# Patient Record
Sex: Female | Born: 1940 | Race: White | Hispanic: No | Marital: Married | Smoking: Never smoker
Health system: Southern US, Community
[De-identification: ages and names within clinical notes are randomized; demographics above are authoritative.]

## PROBLEM LIST (undated history)

## (undated) DIAGNOSIS — G43909 Migraine, unspecified, not intractable, without status migrainosus: Secondary | ICD-10-CM

## (undated) DIAGNOSIS — I639 Cerebral infarction, unspecified: Secondary | ICD-10-CM

## (undated) DIAGNOSIS — I619 Nontraumatic intracerebral hemorrhage, unspecified: Secondary | ICD-10-CM

## (undated) DIAGNOSIS — I671 Cerebral aneurysm, nonruptured: Secondary | ICD-10-CM

---

## 2005-04-30 DIAGNOSIS — I619 Nontraumatic intracerebral hemorrhage, unspecified: Secondary | ICD-10-CM

## 2005-04-30 HISTORY — DX: Nontraumatic intracerebral hemorrhage, unspecified: I61.9

## 2005-04-30 HISTORY — PX: BRAIN SURGERY: SHX531

## 2014-10-28 ENCOUNTER — Other Ambulatory Visit: Payer: Self-pay | Admitting: Family

## 2014-10-28 DIAGNOSIS — Z1231 Encounter for screening mammogram for malignant neoplasm of breast: Secondary | ICD-10-CM

## 2014-11-16 ENCOUNTER — Observation Stay (HOSPITAL_COMMUNITY)
Admission: EM | Admit: 2014-11-16 | Discharge: 2014-11-18 | Disposition: A | Discharge status: Home / Self Care | Payer: PPO | Attending: Neurology | Admitting: Neurology

## 2014-11-16 ENCOUNTER — Emergency Department (HOSPITAL_COMMUNITY): Payer: PPO

## 2014-11-16 ENCOUNTER — Encounter (HOSPITAL_COMMUNITY): Payer: Self-pay | Admitting: Emergency Medicine

## 2014-11-16 DIAGNOSIS — F419 Anxiety disorder, unspecified: Secondary | ICD-10-CM | POA: Insufficient documentation

## 2014-11-16 DIAGNOSIS — I10 Essential (primary) hypertension: Secondary | ICD-10-CM | POA: Diagnosis not present

## 2014-11-16 DIAGNOSIS — Z8673 Personal history of transient ischemic attack (TIA), and cerebral infarction without residual deficits: Secondary | ICD-10-CM | POA: Insufficient documentation

## 2014-11-16 DIAGNOSIS — Z66 Do not resuscitate: Secondary | ICD-10-CM | POA: Diagnosis not present

## 2014-11-16 DIAGNOSIS — Z8679 Personal history of other diseases of the circulatory system: Secondary | ICD-10-CM

## 2014-11-16 DIAGNOSIS — R1013 Epigastric pain: Secondary | ICD-10-CM | POA: Insufficient documentation

## 2014-11-16 DIAGNOSIS — G43909 Migraine, unspecified, not intractable, without status migrainosus: Secondary | ICD-10-CM | POA: Diagnosis not present

## 2014-11-16 DIAGNOSIS — G43009 Migraine without aura, not intractable, without status migrainosus: Secondary | ICD-10-CM

## 2014-11-16 DIAGNOSIS — F32A Depression, unspecified: Secondary | ICD-10-CM | POA: Diagnosis present

## 2014-11-16 DIAGNOSIS — E039 Hypothyroidism, unspecified: Secondary | ICD-10-CM | POA: Diagnosis not present

## 2014-11-16 DIAGNOSIS — R079 Chest pain, unspecified: Principal | ICD-10-CM | POA: Diagnosis present

## 2014-11-16 DIAGNOSIS — E785 Hyperlipidemia, unspecified: Secondary | ICD-10-CM | POA: Diagnosis not present

## 2014-11-16 DIAGNOSIS — F329 Major depressive disorder, single episode, unspecified: Secondary | ICD-10-CM | POA: Diagnosis not present

## 2014-11-16 DIAGNOSIS — R1310 Dysphagia, unspecified: Secondary | ICD-10-CM

## 2014-11-16 DIAGNOSIS — R110 Nausea: Secondary | ICD-10-CM | POA: Diagnosis not present

## 2014-11-16 HISTORY — DX: Cerebral infarction, unspecified: I63.9

## 2014-11-16 HISTORY — DX: Nontraumatic intracerebral hemorrhage, unspecified: I61.9

## 2014-11-16 HISTORY — DX: Cerebral aneurysm, nonruptured: I67.1

## 2014-11-16 HISTORY — DX: Migraine, unspecified, not intractable, without status migrainosus: G43.909

## 2014-11-16 LAB — CBC WITH DIFFERENTIAL/PLATELET
Basophils Absolute: 0 10*3/uL (ref 0.0–0.1)
Basophils Relative: 1 % (ref 0–1)
Eosinophils Absolute: 0.1 10*3/uL (ref 0.0–0.7)
Eosinophils Relative: 1 % (ref 0–5)
HCT: 36.4 % (ref 36.0–46.0)
Hemoglobin: 12 g/dL (ref 12.0–15.0)
Lymphocytes Relative: 23 % (ref 12–46)
Lymphs Abs: 1.3 10*3/uL (ref 0.7–4.0)
MCH: 29.6 pg (ref 26.0–34.0)
MCHC: 33 g/dL (ref 30.0–36.0)
MCV: 89.9 fL (ref 78.0–100.0)
MONO ABS: 0.5 10*3/uL (ref 0.1–1.0)
Monocytes Relative: 9 % (ref 3–12)
NEUTROS PCT: 66 % (ref 43–77)
Neutro Abs: 3.9 10*3/uL (ref 1.7–7.7)
Platelets: 170 10*3/uL (ref 150–400)
RBC: 4.05 MIL/uL (ref 3.87–5.11)
RDW: 12.5 % (ref 11.5–15.5)
WBC: 5.8 10*3/uL (ref 4.0–10.5)

## 2014-11-16 LAB — URINALYSIS, ROUTINE W REFLEX MICROSCOPIC
BILIRUBIN URINE: NEGATIVE
Glucose, UA: NEGATIVE mg/dL
Ketones, ur: NEGATIVE mg/dL
Nitrite: NEGATIVE
Protein, ur: 30 mg/dL — AB
SPECIFIC GRAVITY, URINE: 1.005 (ref 1.005–1.030)
Urobilinogen, UA: 0.2 mg/dL (ref 0.0–1.0)
pH: 6.5 (ref 5.0–8.0)

## 2014-11-16 LAB — URINE MICROSCOPIC-ADD ON

## 2014-11-16 LAB — BASIC METABOLIC PANEL
Anion gap: 5 (ref 5–15)
BUN: 15 mg/dL (ref 6–20)
CHLORIDE: 110 mmol/L (ref 101–111)
CO2: 25 mmol/L (ref 22–32)
CREATININE: 0.81 mg/dL (ref 0.44–1.00)
Calcium: 8.7 mg/dL — ABNORMAL LOW (ref 8.9–10.3)
GFR calc Af Amer: >60 mL/min (ref >60)
GFR calc non Af Amer: >60 mL/min (ref >60)
Glucose, Bld: 131 mg/dL — ABNORMAL HIGH (ref 65–99)
Potassium: 3.6 mmol/L (ref 3.5–5.1)
Sodium: 140 mmol/L (ref 135–145)

## 2014-11-16 LAB — TROPONIN I: Troponin I: <0.03 ng/mL (ref <0.031)

## 2014-11-16 LAB — BRAIN NATRIURETIC PEPTIDE: B NATRIURETIC PEPTIDE 5: 61.1 pg/mL (ref 0.0–100.0)

## 2014-11-16 MED ORDER — LORAZEPAM 2 MG/ML IJ SOLN
0.5000 mg | Freq: Once | INTRAMUSCULAR | Status: AC
  Administered 2014-11-16: 0.5 mg via INTRAVENOUS

## 2014-11-16 MED ORDER — LORAZEPAM 2 MG/ML IJ SOLN
2.0000 mg | Freq: Once | INTRAMUSCULAR | Status: DC
  Filled 2014-11-16: qty 1

## 2014-11-16 MED ORDER — ACETAMINOPHEN 325 MG PO TABS: 650.0000 mg | ORAL_TABLET | ORAL | Status: DC | PRN

## 2014-11-16 MED ORDER — METOPROLOL TARTRATE 25 MG PO TABS
25.0000 mg | ORAL_TABLET | Freq: Two times a day (BID) | ORAL | Status: DC
  Administered 2014-11-16 – 2014-11-18 (×4): 25 mg via ORAL
  Filled 2014-11-16 (×6): qty 1

## 2014-11-16 MED ORDER — ENOXAPARIN SODIUM 40 MG/0.4ML ~~LOC~~ SOLN
40.0000 mg | Freq: Every day | SUBCUTANEOUS | Status: DC
  Administered 2014-11-16 – 2014-11-17 (×2): 40 mg via SUBCUTANEOUS
  Filled 2014-11-16 (×4): qty 0.4

## 2014-11-16 MED ORDER — NITROGLYCERIN 0.4 MG SL SUBL
0.4000 mg | SUBLINGUAL_TABLET | SUBLINGUAL | Status: DC | PRN
  Administered 2014-11-16: 0.4 mg via SUBLINGUAL
  Filled 2014-11-16: qty 1

## 2014-11-16 MED ORDER — BUSPIRONE HCL 10 MG PO TABS
10.0000 mg | ORAL_TABLET | Freq: Two times a day (BID) | ORAL | Status: DC
  Administered 2014-11-16 – 2014-11-18 (×4): 10 mg via ORAL
  Filled 2014-11-16 (×5): qty 1

## 2014-11-16 MED ORDER — GI COCKTAIL ~~LOC~~
30.0000 mL | Freq: Four times a day (QID) | ORAL | Status: DC | PRN
  Administered 2014-11-17: 30 mL via ORAL
  Filled 2014-11-16: qty 30

## 2014-11-16 MED ORDER — ASPIRIN EC 325 MG PO TBEC
325.0000 mg | DELAYED_RELEASE_TABLET | Freq: Every day | ORAL | Status: DC
  Administered 2014-11-16 – 2014-11-18 (×3): 325 mg via ORAL
  Filled 2014-11-16 (×3): qty 1

## 2014-11-16 MED ORDER — LEVOTHYROXINE SODIUM 25 MCG PO TABS
25.0000 ug | ORAL_TABLET | Freq: Every day | ORAL | Status: DC
  Administered 2014-11-17 – 2014-11-18 (×2): 25 ug via ORAL
  Filled 2014-11-16 (×4): qty 1

## 2014-11-16 MED ORDER — ATORVASTATIN CALCIUM 20 MG PO TABS
20.0000 mg | ORAL_TABLET | Freq: Every day | ORAL | Status: DC
  Administered 2014-11-16 – 2014-11-17 (×2): 20 mg via ORAL
  Filled 2014-11-16 (×4): qty 1

## 2014-11-16 MED ORDER — MIRTAZAPINE 30 MG PO TABS
30.0000 mg | ORAL_TABLET | Freq: Every day | ORAL | Status: DC
  Administered 2014-11-16 – 2014-11-17 (×2): 30 mg via ORAL
  Filled 2014-11-16: qty 2
  Filled 2014-11-16 (×3): qty 1

## 2014-11-16 MED ORDER — TOPIRAMATE 25 MG PO TABS
25.0000 mg | ORAL_TABLET | Freq: Two times a day (BID) | ORAL | Status: DC
  Administered 2014-11-16 – 2014-11-18 (×4): 25 mg via ORAL
  Filled 2014-11-16 (×7): qty 1

## 2014-11-16 MED ORDER — DARIFENACIN HYDROBROMIDE ER 7.5 MG PO TB24
7.5000 mg | ORAL_TABLET | Freq: Every day | ORAL | Status: DC
  Administered 2014-11-17: 7.5 mg via ORAL
  Filled 2014-11-16 (×2): qty 1

## 2014-11-16 MED ORDER — ONDANSETRON HCL 4 MG/2ML IJ SOLN: 4.0000 mg | Freq: Four times a day (QID) | INTRAMUSCULAR | Status: DC | PRN

## 2014-11-16 MED ORDER — PRAMIPEXOLE DIHYDROCHLORIDE 0.25 MG PO TABS
0.2500 mg | ORAL_TABLET | Freq: Every day | ORAL | Status: DC
  Administered 2014-11-16 – 2014-11-17 (×2): 0.25 mg via ORAL
  Filled 2014-11-16 (×3): qty 1

## 2014-11-16 NOTE — ED Notes (Signed)
Report attempted 

## 2014-11-16 NOTE — ED Notes (Signed)
Spoke to MD about pt placement

## 2014-11-16 NOTE — ED Notes (Signed)
Meal tray arrived

## 2014-11-16 NOTE — ED Provider Notes (Signed)
CSN: [id number]     Arrival date & time 11/16/14  1455 History   First MD Initiated Contact with Patient 11/16/14 1508     Chief Complaint  Patient presents with  . Chest Pain     (Consider location/radiation/quality/duration/timing/severity/associated sxs/prior Treatment) HPI Comments: 74 y/o F comes in with cc of epigastric chest pain. She has hx of brain AN, HL. She reports just having started eating when she started having chest tightness, epigastric, pressure like pain. The pain was non radiating. Pt also had associated nausea and dib. EMS reports anxious appearing pt when they arrived. She received full dose aspirin and nitro, and her pain resolved, and she is currently chest pain free. Pt reports no hx of similar pain. She has not had a cardiac workup. She also denies any GERD hx, esophagitis.   ROS 10 Systems reviewed and are negative for acute change except as noted in the HPI.     The history is provided by the patient.    Past Medical History  Diagnosis Date  . Brain aneurysm   . Hemorrhagic stroke 2007  . Migraines    Past Surgical History  Procedure Laterality Date  . Brain surgery  2007   No family history on file. History  Substance Use Topics  . Smoking status: Never Smoker   . Smokeless tobacco: Not on file  . Alcohol Use: No   OB History    No data available     Review of Systems  Cardiovascular: Positive for chest pain.  All other systems reviewed and are negative.     Allergies  Review of patient's allergies indicates no known allergies.  Home Medications   Prior to Admission medications   Medication Sig Start Date End Date Taking? Authorizing Provider  acetaminophen (TYLENOL) 325 MG tablet Take 325 mg by mouth every 6 (six) hours as needed.   Yes Historical Provider, MD  atorvastatin (LIPITOR) 20 MG tablet Take 20 mg by mouth daily.   Yes Historical Provider, MD  busPIRone (BUSPAR) 10 MG tablet Take 10 mg by mouth 2 (two) times daily.    Yes Historical Provider, MD  levothyroxine (SYNTHROID, LEVOTHROID) 25 MCG tablet Take 25 mcg by mouth daily before breakfast.   Yes Historical Provider, MD  mirtazapine (REMERON) 30 MG tablet Take 30 mg by mouth at bedtime.   Yes Historical Provider, MD  pramipexole (MIRAPEX) 0.25 MG tablet Take 0.25 mg by mouth at bedtime.   Yes Historical Provider, MD  solifenacin (VESICARE) 10 MG tablet Take 10 mg by mouth at bedtime.   Yes Historical Provider, MD  topiramate (TOPAMAX) 25 MG tablet Take 25 mg by mouth 2 (two) times daily.   Yes Historical Provider, MD   BP 120/50 mmHg  Pulse 68  Temp(Src) 98.3 F (36.8 C) (Oral)  Resp 19  Ht  (1.626 m)  Wt 190 lb (86.183 kg)  BMI 32.60 kg/m2  SpO2 94% Physical Exam  Constitutional: She is oriented to person, place, and time. She appears well-developed and well-nourished.  HENT:  Head: Normocephalic and atraumatic.  Eyes: EOM are normal. Pupils are equal, round, and reactive to light.  Neck: Neck supple.  Cardiovascular: Normal rate, regular rhythm, normal heart sounds and intact distal pulses.   No murmur heard. Pulmonary/Chest: Effort normal. No respiratory distress.  Abdominal: Soft. She exhibits no distension. There is no tenderness. There is no rebound and no guarding.  Neurological: She is alert and oriented to person, place, and time.  Skin:  Skin is warm and dry.  Nursing note and vitals reviewed.   ED Course  Procedures (including critical care time) Labs Review Labs Reviewed  BASIC METABOLIC PANEL - Abnormal; Notable for the following:    Glucose, Bld 131 (*)    Calcium 8.7 (*)    All other components within normal limits  URINALYSIS, ROUTINE W REFLEX MICROSCOPIC (NOT AT [facility]) - Abnormal; Notable for the following:    APPearance CLOUDY (*)    Hgb urine dipstick LARGE (*)    Protein, ur 30 (*)    Leukocytes, UA TRACE (*)    All other components within normal limits  URINE MICROSCOPIC-ADD ON - Abnormal; Notable for the  following:    Bacteria, UA MANY (*)    All other components within normal limits  URINE CULTURE  CBC WITH DIFFERENTIAL/PLATELET  TROPONIN I  BRAIN NATRIURETIC PEPTIDE    Imaging Review Dg Chest 2 View  11/16/2014   CLINICAL DATA:  Sudden onset of chest and epigastric pain while eating lunch today. Initial encounter.  EXAM: CHEST  2 VIEW  COMPARISON:  None.  FINDINGS: The heart size is normal. There is mild aortic tortuosity. The lungs appear well aerated on the frontal examination. On the lateral view, there is a lesser degree of inspiration with probable mild bibasilar atelectasis. No edema, pleural effusion or pneumothorax. The bones appear unremarkable.  IMPRESSION: No acute cardiopulmonary process.  Aortic tortuosity.   Electronically Signed   By: Carey BullocksWilliam  Veazey M.D.   On: 11/16/2014 16:12     EKG Interpretation   Date/Time:  Tuesday November 16 2014 14:55:40 EDT Ventricular Rate:  82 PR Interval:    QRS Duration: 112 QT Interval:  415 QTC Calculation: 485 R Axis:   20 Text Interpretation:  Atrial fibrillation Borderline intraventricular  conduction delay ST elevation, consider lateral injury Artifact in lead(s)  I aVL too much artifact Confirmed by Rhunette CroftNANAVATI, MD, Shriya Aker [phone]([id number]) on  11/16/2014 3:12:56 PM     EKG Interpretation  Date/Time:  Tuesday November 16 2014 17:58:20 EDT Ventricular Rate:  140 PR Interval:  198 QRS Duration: 74 QT Interval:  352 QTC Calculation: 537 R Axis:   4 Text Interpretation:  Sinus rhythm artifact vs flutter waves Confirmed by Rhunette CroftNANAVATI, MD, Janey GentaANKIT [phone]([id number]) on 11/16/2014 6:15:46 PM          MDM   Final diagnoses:  Chest pain     Pt comes in with cc of chest pain. Chest pain is epigastric, pressure type and responded to nitro. It is atypical.  Differential diagnosis includes: ACS syndrome Pericarditis Pericardial effusion PE Musculoskeletal pain GERD Esophageal spasm  Pt comes in with cc of chest pain, now resolved.  HEAR score is  3, age and risk factors. It appears to be GERD/PUD, but she has no hx of it, and with some of the other constitutionals, we feel it will be best to keep her in for acs rule out.  EKG - no acute changes.  Derwood KaplanAnkit Talvin Christianson, MD 11/16/14 1820

## 2014-11-16 NOTE — ED Notes (Signed)
Delay explained to pt on bed placement and order for heart healthy tray placed by secretary.

## 2014-11-16 NOTE — ED Notes (Signed)
Per EMS:  Eating lunch, sudden onset cp/epigastric pain.  Anxious upon ems arrival with hx of anxiety.  12 lead unremarkable, although lots of artifact.  20 gauge left hand.  324 mg asa given and 3 SL NTG's,  Currently denies pain.  Initially hypertensive 198/100, 175/93 after NTG.   Currently 83 HR, 26, 162/77, 95%.  Denies sob, initially had nausea but was gone by the time EMS arrived.

## 2014-11-16 NOTE — H&P (Deleted)
[facility] History and Physical  Phyllis AveCarolyn Mccarn ZOX:[id number] DOB: 08/29/1940 DOA: 11/16/2014  Referring physician: Dr Rhunette CroftNanavati PCP: No primary care provider on file.   Chief Complaint: Chest pain  HPI: Phyllis Nielsen is a 74 y.o. 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  Was eating dinner at her apt today and early into the meal (chicken) she developed an epigastric discomfort which turned into pain w assoc nausea.  Didn't want to vomit in the cafeteria. Tried drinking liquids but that didn't help.. No radiation, no SOB , +anxious.  EMS called and with SL NTG her symptoms resolved.  Brought to ED where EKG is poor tracing due to tremor artifact but shows NSR w/o acute ischemic changes. CXR is neg, UA neg, labs are all wnl.  Trop is normal and BNP is normal. She has hx of swollen ankles lately, no hx CHF, no prior CAD/ MI/ stent. Nonsmoker, no hx DM or HTN.       Home meds > Lipitor, Buspar, synthroid, Remeron, Mirapex, Vesicare, Topamax  ROS  denies HA  no neck pain  no abd pain  no diarrhea  no rash  no joint pain  no fevers  Where does patient live independent living for the elderly Can patient participate in ADLs? yes  Past Medical History  Past Medical History  Diagnosis Date  . Brain aneurysm   . Hemorrhagic stroke 2007  . Migraines    Past Surgical History  Past Surgical History  Procedure Laterality Date  . Brain surgery  2007   Family History No family history on file.  Social History  reports that she has never smoked. She does not have any smokeless tobacco history on file. She reports that she does not drink alcohol. Her drug history is not on file. Allergies No Known Allergies Home medications Prior to Admission medications   Medication Sig Start Date End Date Taking? Authorizing Provider  acetaminophen (TYLENOL) 325 MG tablet Take 325 mg by mouth every 6 (six) hours as needed.   Yes  Historical Provider, MD  atorvastatin (LIPITOR) 20 MG tablet Take 20 mg by mouth daily.   Yes Historical Provider, MD  busPIRone (BUSPAR) 10 MG tablet Take 10 mg by mouth 2 (two) times daily.   Yes Historical Provider, MD  levothyroxine (SYNTHROID, LEVOTHROID) 25 MCG tablet Take 25 mcg by mouth daily before breakfast.   Yes Historical Provider, MD  mirtazapine (REMERON) 30 MG tablet Take 30 mg by mouth at bedtime.   Yes Historical Provider, MD  pramipexole (MIRAPEX) 0.25 MG tablet Take 0.25 mg by mouth at bedtime.   Yes Historical Provider, MD  solifenacin (VESICARE) 10 MG tablet Take 10 mg by mouth at bedtime.   Yes Historical Provider, MD  topiramate (TOPAMAX) 25 MG tablet Take 25 mg by mouth 2 (two) times daily.   Yes Historical Provider, MD   Liver Function Tests No results for input(s): AST, ALT, ALKPHOS, BILITOT, PROT, ALBUMIN in the last 168 hours. No results for input(s): LIPASE, AMYLASE in the last 168 hours. CBC  Recent Labs Lab 11/16/14 1552  WBC 5.8  NEUTROABS 3.9  HGB 12.0  HCT 36.4  MCV 89.9  PLT 170   Basic Metabolic Panel  Recent Labs Lab 11/16/14 1552  NA 140  K 3.6  CL 110  CO2 25  GLUCOSE 131*  BUN 15  CREATININE 0.81  CALCIUM 8.7*   Filed Vitals:   11/16/14 1511 11/16/14 1512 11/16/14 1651  BP: 162/77  120/50  Pulse: 91  68  Temp:  98.3 F (36.8 C)   TempSrc: Oral Oral   Resp: 16  19  Height:  (1.626 m)    Weight: 86.183 kg (190 lb)    SpO2: 94%  94%   Exam: Alert, a little anxious and tremulous, not in distress No rash, cyanosis or gangrene Sclera anicteric, throat clear Small bony defect R temple region from prior rupt aneurysm No jvd Chest clear bilat RRR no MRG Abd soft ntnd no mass or ascites  EKG (reviewed personally) > NSR 80's, Q wave lead III, lots of artifact due to tremors but on exam has stable rhythm c/w NSR CXR (reviewed personally) > stable, no acute cardiopulm process BUN 15  Creat 0.81  Trop <0.03  BNP 61 WBC 5k,   Hb 12  plt 170 UA negative  Assessment: 1. Chest pain - occurred while eating, sustained, responded to SL NTG. Not a lot of risk factors. Trop neg. EKG prob neg too but w/ sig tremor artifact. Plan admit, serial trop's, echo in am.  2. Hx ruptured cerebral aneurysm/ hemorrhagic stroke 3. HL 4. Migraine HA's on topomax 5. Depression on meds 6. DNAR - pt request, ready to "see my father and two dead husbands when my time comes". Daughter present at time of discussion and supports her decision.   Plan - as above   DVT Prophylaxis lovenox  Code Status: DNAR  Family Communication: daughter at bedside  Disposition Plan: home when stable    Mabrey Howland D [facility] Pager [phone]  If 7PM-7AM, please contact night-coverage [email or web address] Password [facility] 11/16/2014, 5:47 PM

## 2014-11-17 ENCOUNTER — Observation Stay (HOSPITAL_COMMUNITY): Payer: PPO

## 2014-11-17 DIAGNOSIS — R079 Chest pain, unspecified: Secondary | ICD-10-CM

## 2014-11-17 DIAGNOSIS — E785 Hyperlipidemia, unspecified: Secondary | ICD-10-CM | POA: Diagnosis not present

## 2014-11-17 DIAGNOSIS — K219 Gastro-esophageal reflux disease without esophagitis: Secondary | ICD-10-CM | POA: Diagnosis not present

## 2014-11-17 DIAGNOSIS — Z66 Do not resuscitate: Secondary | ICD-10-CM | POA: Diagnosis not present

## 2014-11-17 DIAGNOSIS — I10 Essential (primary) hypertension: Secondary | ICD-10-CM | POA: Diagnosis not present

## 2014-11-17 DIAGNOSIS — G43909 Migraine, unspecified, not intractable, without status migrainosus: Secondary | ICD-10-CM | POA: Diagnosis not present

## 2014-11-17 LAB — TROPONIN I: Troponin I: <0.03 ng/mL (ref <0.031)

## 2014-11-17 LAB — TSH: TSH: 2.605 u[IU]/mL (ref 0.350–4.500)

## 2014-11-17 LAB — MRSA PCR SCREENING: MRSA by PCR: NEGATIVE

## 2014-11-17 NOTE — Progress Notes (Signed)
[facility] PROGRESS NOTE  Phyllis Nielsen NFA:[id number] DOB: October 03, 1940 DOA: 11/16/2014 PCP: No primary care provider on file.  Assessment/Plan: 1. Chest pain -Patient presenting with chest pain having atypical features. She describes chest pain precipitated by eating. She denies having history of exertional shortness of breath or chest pain. Lab work showing 3 negative troponins, BNP of 61.1 and chest x-ray not show an acute infiltrate or acute cardiopulmonary disease. -I doubt chest pain of cardiac origin however I am suspicious of this being related to GI. She complains of retrosternal chest pain characterized as "burning" along with a sensation of food getting stuck in her esophagus.  -Will provide GI cocktail -Further workup with barium swallow. She was made nothing by mouth  2.  Dyslipidemia -Continue Lipitor 20 mg by mouth daily  3.  Hypothyroidism -Will check a TSH -Continue Synthroid 25 g by mouth daily  4.  Hypertension -Blood pressures controlled overnight, this morning having blood pressure 165/84, has not received a.m. meds -Continue metoprolol 25 mg by mouth twice a day  Code Status: DO NOT RESUSCITATE Family Communication: Family not present Disposition Plan: We'll transfer to telemetry   Consultants:    Procedures:    Antibiotics:    HPI/Subjective: Patient is a 74 year old female who was admitted to the medicine service on 11/16/2014 when she presented with complaints of sudden onset retrosternal chest pain that occurred while eating dinner. She also complained of epigastric discomfort associated with nausea, denied emesis. Initial workup in the emergency room included an EKG that show acute ischemic changes. Chest x-ray did not reveal infiltrate as troponins were negative.  Objective: Filed Vitals:   11/17/14 0816  BP: 165/84  Pulse: 71  Temp: 98.8 F (37.1 C)  Resp: 18    Intake/Output Summary (Last 24 hours) at 11/17/14 0820 Last data  filed at 11/17/14 0300  Gross per 24 hour  Intake      0 ml  Output    200 ml  Net   -200 ml   Filed Weights   11/16/14 1511  Weight: 86.183 kg (190 lb)    Exam:   General:  Patient is in no acute distress she is awake and alert oriented, currently denies chest pain although states she is afraid to eat for fear of precipitating chest pain  Cardiovascular: Regular rate and rhythm normal S1-S2  Respiratory: Normal respiratory effort lungs are clear  Abdomen: Soft nontender nondistended  Musculoskeletal: No edema  Data Reviewed: Basic Metabolic Panel:  Recent Labs Lab 11/16/14 1552  NA 140  K 3.6  CL 110  CO2 25  GLUCOSE 131*  BUN 15  CREATININE 0.81  CALCIUM 8.7*   Liver Function Tests: No results for input(s): AST, ALT, ALKPHOS, BILITOT, PROT, ALBUMIN in the last 168 hours. No results for input(s): LIPASE, AMYLASE in the last 168 hours. No results for input(s): AMMONIA in the last 168 hours. CBC:  Recent Labs Lab 11/16/14 1552  WBC 5.8  NEUTROABS 3.9  HGB 12.0  HCT 36.4  MCV 89.9  PLT 170   Cardiac Enzymes:  Recent Labs Lab 11/16/14 1552 11/16/14 2228 11/17/14 0129  TROPONINI <0.03 <0.03 <0.03   BNP (last 3 results)  Recent Labs  11/16/14 1552  BNP 61.1    ProBNP (last 3 results) No results for input(s): PROBNP in the last 8760 hours.  CBG: No results for input(s): GLUCAP in the last 168 hours.  Recent Results (from the past 240 hour(s))  MRSA PCR Screening  Status: None   Collection Time: 11/16/14 10:26 PM  Result Value Ref Range Status   MRSA by PCR NEGATIVE NEGATIVE Final    Comment:        The GeneXpert MRSA Assay (FDA approved for NASAL specimens only), is one component of a comprehensive MRSA colonization surveillance program. It is not intended to diagnose MRSA infection nor to guide or monitor treatment for MRSA infections.      Studies: Dg Chest 2 View  11/16/2014   CLINICAL DATA:  Sudden onset of chest and  epigastric pain while eating lunch today. Initial encounter.  EXAM: CHEST  2 VIEW  COMPARISON:  None.  FINDINGS: The heart size is normal. There is mild aortic tortuosity. The lungs appear well aerated on the frontal examination. On the lateral view, there is a lesser degree of inspiration with probable mild bibasilar atelectasis. No edema, pleural effusion or pneumothorax. The bones appear unremarkable.  IMPRESSION: No acute cardiopulmonary process.  Aortic tortuosity.   Electronically Signed   By: Carey BullocksWilliam  Veazey M.D.   On: 11/16/2014 16:12    Scheduled Meds: . aspirin EC  325 mg Oral Daily  . atorvastatin  20 mg Oral q1800  . busPIRone  10 mg Oral BID  . darifenacin  7.5 mg Oral Daily  . enoxaparin (LOVENOX) injection  40 mg Subcutaneous QHS  . levothyroxine  25 mcg Oral QAC breakfast  . metoprolol tartrate  25 mg Oral BID  . mirtazapine  30 mg Oral QHS  . pramipexole  0.25 mg Oral QHS  . topiramate  25 mg Oral BID   Continuous Infusions:   Active Problems:   Chest pain   History of ruptured cerebral arterial aneurysm   Depression   Migraine headache   Hyperlipidemia    Time spent: 35 minutes    Jeralyn BennettZAMORA, Phyllis Nielsen  [facility] Pager [phone]. If 7PM-7AM, please contact night-coverage at [email or web address], password [email or web address] 11/17/2014, 8:20 AM

## 2014-11-17 NOTE — H&P (Signed)
Phyllis Nielsen Signed Nephrology H&P 11/16/2014 5:47 PM     [facility] History and Physical  Phyllis Nielsen WUJ:[id number] DOB: 1940-10-24 DOA: 11/16/2014  Referring physician: Dr Rhunette Croft PCP: No primary care provider on file.   Chief Complaint: Chest pain  HPI: Phyllis Nielsen is a 74 y.o. 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 Was eating dinner at her apt today and early into the meal (chicken) she developed an epigastric discomfort which turned into pain w assoc nausea. Didn't want to vomit in the cafeteria. Tried drinking liquids but that didn't help.. No radiation, no SOB , +anxious. EMS called and with SL NTG her symptoms resolved. Brought to ED where EKG is poor tracing due to tremor artifact but shows NSR w/o acute ischemic changes. CXR is neg, UA neg, labs are all wnl. Trop is normal and BNP is normal. She has hx of swollen ankles lately, no hx CHF, no prior CAD/ MI/ stent. Nonsmoker, no hx DM or HTN.     Home meds > Lipitor, Buspar, synthroid, Remeron, Mirapex, Vesicare, Topamax  ROS denies HA no neck pain no abd pain no diarrhea no rash no joint pain no fevers  Where does patient live independent living for the elderly Can patient participate in ADLs? yes  Past Medical History  Past Medical History  Diagnosis Date  . Brain aneurysm   . Hemorrhagic stroke 2007  . Migraines    Past Surgical History  Past Surgical History  Procedure Laterality Date  . Brain surgery  2007   Family History No family history on file.  Social History  reports that she has never smoked. She does not have any smokeless tobacco history on file. She reports that she does not drink alcohol. Her drug history is not on file. Allergies No Known Allergies Home medications Prior to Admission medications   Medication Sig Start Date End Date Taking? Authorizing  Provider  acetaminophen (TYLENOL) 325 MG tablet Take 325 mg by mouth every 6 (six) hours as needed.   Yes Historical Provider, MD  atorvastatin (LIPITOR) 20 MG tablet Take 20 mg by mouth daily.   Yes Historical Provider, MD  busPIRone (BUSPAR) 10 MG tablet Take 10 mg by mouth 2 (two) times daily.   Yes Historical Provider, MD  levothyroxine (SYNTHROID, LEVOTHROID) 25 MCG tablet Take 25 mcg by mouth daily before breakfast.   Yes Historical Provider, MD  mirtazapine (REMERON) 30 MG tablet Take 30 mg by mouth at bedtime.   Yes Historical Provider, MD  pramipexole (MIRAPEX) 0.25 MG tablet Take 0.25 mg by mouth at bedtime.   Yes Historical Provider, MD  solifenacin (VESICARE) 10 MG tablet Take 10 mg by mouth at bedtime.   Yes Historical Provider, MD  topiramate (TOPAMAX) 25 MG tablet Take 25 mg by mouth 2 (two) times daily.   Yes Historical Provider, MD   Liver Function Tests  Last Labs     No results for input(s): AST, ALT, ALKPHOS, BILITOT, PROT, ALBUMIN in the last 168 hours.    Last Labs     No results for input(s): LIPASE, AMYLASE in the last 168 hours.   CBC  Last Labs      Recent Labs Lab 11/16/14 1552  WBC 5.8  NEUTROABS 3.9  HGB 12.0  HCT 36.4  MCV 89.9  PLT 170     Basic Metabolic Panel  Last Labs      Recent Labs Lab 11/16/14 1552  NA  140  K 3.6  CL 110  CO2 25  GLUCOSE 131*  BUN 15  CREATININE 0.81  CALCIUM 8.7*     Filed Vitals:   11/16/14 1511 11/16/14 1512 11/16/14 1651  BP: 162/77  120/50  Pulse: 91  68  Temp:  98.3 F (36.8 C)   TempSrc: Oral Oral   Resp: 16  19  Height: 5\' 4"  (1.626 m)    Weight: 86.183 kg (190 lb)    SpO2: 94%  94%   Exam: Alert, a little anxious and tremulous, not in distress No rash, cyanosis or gangrene Sclera anicteric, throat clear Small bony defect R temple region from prior rupt aneurysm No  jvd Chest clear bilat RRR no MRG Abd soft ntnd no mass or ascites  EKG (reviewed personally) > NSR 80's, Q wave lead III, lots of artifact due to tremors but on exam has stable rhythm c/w NSR CXR (reviewed personally) > stable, no acute cardiopulm process BUN 15 Creat 0.81 Trop <0.03 BNP 61 WBC 5k, Hb 12 plt 170 UA negative  Assessment: 1. Chest pain - occurred while eating, sustained, responded to SL NTG. Not a lot of risk factors. Trop neg. EKG prob neg too but w/ sig tremor artifact. Plan admit, serial trop's, echo in am.  2. Hx ruptured cerebral aneurysm/ hemorrhagic stroke 3. HL 4. Migraine HA's on topomax 5. Depression on meds 6. DNAR - pt request, ready to "see my father and two dead husbands when my time comes". Daughter present at time of discussion and supports her decision.  Plan - as above   DVT Prophylaxis lovenox  Code Status: DNAR  Family Communication: daughter at bedside  Disposition Plan: home when stable    Phyllis Nielsen D [facility] Pager [phone]  If 7PM-7AM, please contact night-coverage [email or web address] Password [email or web address] 11/16/2014, 5:47 PM         Routing History     Date/Time From To Method   11/16/2014 6:40 PM Delano Metzobert Nyima Vanacker, MD Delano Metzobert Saivion Goettel, MD Fax

## 2014-11-17 NOTE — Progress Notes (Signed)
  Echocardiogram 2D Echocardiogram has been performed.  Phyllis Nielsen 11/17/2014, 12:06 PM

## 2014-11-18 DIAGNOSIS — R079 Chest pain, unspecified: Secondary | ICD-10-CM | POA: Diagnosis not present

## 2014-11-18 DIAGNOSIS — K219 Gastro-esophageal reflux disease without esophagitis: Secondary | ICD-10-CM | POA: Diagnosis not present

## 2014-11-18 DIAGNOSIS — E785 Hyperlipidemia, unspecified: Secondary | ICD-10-CM | POA: Diagnosis not present

## 2014-11-18 DIAGNOSIS — I10 Essential (primary) hypertension: Secondary | ICD-10-CM

## 2014-11-18 LAB — URINE CULTURE

## 2014-11-18 MED ORDER — PANTOPRAZOLE SODIUM 40 MG PO TBEC: 40.0000 mg | DELAYED_RELEASE_TABLET | Freq: Every day | ORAL | Status: AC

## 2014-11-18 MED ORDER — METOPROLOL TARTRATE 25 MG PO TABS: 25.0000 mg | ORAL_TABLET | Freq: Two times a day (BID) | ORAL | Status: AC

## 2014-11-18 NOTE — Care Management Note (Signed)
Case Management Note  Patient Details  Name: Louana Fontenot MRN: [id number] Date of Birth: 20-May-1940  Subjective/Objective:                 Patient lives at [facility] (ALF) off [address] in [place]. Daughter in room, getting patient dressed for discharge this am. Daughter stated that PCP was Mariane Masters NP with [facility]. F/U to be made by daughter, instructions placed in AVS.    Action/Plan:  Discharge with daughter back to ALF.  Expected Discharge Date:                  Expected Discharge Plan:  Home/Self Care  In-House Referral:     Discharge planning Services  CM Consult  Post Acute Care Choice:    Choice offered to:     DME Arranged:    DME Agency:     HH Arranged:    HH Agency:     Status of Service:  Completed, signed off  Medicare Important Message Given:    Date Medicare IM Given:    Medicare IM give by:    Date Additional Medicare IM Given:    Additional Medicare Important Message give by:     If discussed at Long Length of Stay Meetings, dates discussed:    Additional Comments:  Lawerance Sabal, RN 11/18/2014, 12:08 PM

## 2014-11-18 NOTE — Progress Notes (Signed)
Loreta Ave to be D/C'd Home per MD order.  Discussed with the patient and all questions fully answered.  VSS, Skin clean, dry and intact without evidence of skin break down, no evidence of skin tears noted. IV catheter discontinued intact. Site without signs and symptoms of complications. Dressing and pressure applied.  An After Visit Summary was printed and given to the patient. Patient received prescription.  D/c education completed with patient/family including follow up instructions, medication list, d/c activities limitations if indicated, with other d/c instructions as indicated by MD - patient able to verbalize understanding, all questions fully answered.   Patient instructed to return to ED, call 911, or call MD for any changes in condition.   Patient escorted via WC, and D/C home via private auto.  L'ESPERANCE, Henrietta Cieslewicz C 11/18/2014 10:05 AM

## 2014-11-18 NOTE — Discharge Summary (Signed)
Physician Discharge Summary  Dahna Hattabaugh ZOX:[id number] DOB: 1941-03-14 DOA: 11/16/2014  PCP: No primary care provider on file.  Admit date: 11/16/2014 Discharge date: 11/18/2014  Time spent: 35 minutes  Recommendations for Outpatient Follow-up:  1. Please follow-up on blood pressures, she was discharged on metoprolol 25 mg by mouth twice a day 2. Patient worked up for chest pain likely GI in origin, discharged on Protonix   Discharge Diagnoses:  Active Problems:   Chest pain   History of ruptured cerebral arterial aneurysm   Depression   Migraine headache   Hyperlipidemia   Discharge Condition: Stable  Diet recommendation: Heart healthy  Filed Weights   11/16/14 1511 11/17/14 1412  Weight: 86.183 kg (190 lb) 88.996 kg (196 lb 3.2 oz)    History of present illness:  Phyllis Nielsen is a 74 y.o. 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 Was eating dinner at her apt today and early into the meal (chicken) she developed an epigastric discomfort which turned into pain w assoc nausea. Didn't want to vomit in the cafeteria. Tried drinking liquids but that didn't help.. No radiation, no SOB , +anxious. EMS called and with [facility] her symptoms resolved. Brought to ED where EKG is poor tracing due to tremor artifact but shows NSR w/o acute ischemic changes. CXR is neg, UA neg, labs are all wnl. Trop is normal and BNP is normal. She has hx of swollen ankles lately, no hx CHF, no prior CAD/ MI/ stent. Nonsmoker, no hx DM or HTN.   Hospital Course:  Patient is a 74 year old female who was admitted to the medicine service on 11/16/2014 when she presented with complaints of sudden onset retrosternal chest pain that occurred while eating dinner. She also complained of epigastric discomfort associated with nausea, denied emesis. Initial workup in the emergency room included an EKG that show acute ischemic changes. Chest x-ray did  not reveal infiltrate as troponins were negative. Transthoracic echocardiogram completed on 11/17/2014 revealed a preserved ejection fraction of 65-70% with grade 1 diastolic dysfunction. There were no wall motion abnormalities noted on echo. She describes her chest pain has been precipitated by food and reported sensation of food getting stuck in her chest. This was further worked up with a barium swallow performed on 11/17/2014 that did not reveal evidence of mucosal ulceration, stricture or mass. No significant findings were identified on this study. Patient did not have further episodes of chest pain, had been tolerating by mouth intake, and remained clinically stable. She was discharged back to her independent living facility on 11/18/2014. During this hospitalization she was started on metoprolol 25 mg by mouth twice a day for hypertension along with Protonix 40 mg by mouth daily for possible gastroesophageal reflux disease.  Procedures:  Transthoracic echocardiogram impression: EF 65-70% with grade 1 diastolic dysfunction  Barium swallow performed on 11/17/2014 impression: No evidence of mucosal ulceration, stricture, mass.   Discharge Exam: Filed Vitals:   11/18/14 0501  BP: 144/73  Pulse: 66  Temp: 97.8 F (36.6 C)  Resp: 20    General: Anxious appearing although no acute distress she is awake and alert, ambulating around her room Cardiovascular: Regular rate and rhythm normal S1-S2 no murmurs rubs gallops Respiratory: Normal respiratory effort, lungs are clear to auscultation bilaterally Abdomen: Soft nontender nondistended Extremities: No edema  Discharge Instructions   Discharge Instructions    Call MD for:  difficulty breathing, headache or visual disturbances    Complete by:  As directed      Call MD for:  extreme fatigue    Complete by:  As directed      Call MD for:  hives    Complete by:  As directed      Call MD for:  persistant dizziness or light-headedness     Complete by:  As directed      Call MD for:  persistant nausea and vomiting    Complete by:  As directed      Call MD for:  redness, tenderness, or signs of infection (pain, swelling, redness, odor or green/yellow discharge around incision site)    Complete by:  As directed      Call MD for:  severe uncontrolled pain    Complete by:  As directed      Call MD for:  temperature >100.4    Complete by:  As directed      Call MD for:    Complete by:  As directed      Diet - low sodium heart healthy    Complete by:  As directed      Discharge instructions    Complete by:  As directed   Please follow up with your Primary Care Provider in 1 week     Increase activity slowly    Complete by:  As directed           Current Discharge Medication List    START taking these medications   Details  metoprolol tartrate (LOPRESSOR) 25 MG tablet Take 1 tablet (25 mg total) by mouth 2 (two) times daily. Qty: 60 tablet, Refills: 0    pantoprazole (PROTONIX) 40 MG tablet Take 1 tablet (40 mg total) by mouth daily. Qty: 30 tablet, Refills: 0      CONTINUE these medications which have NOT CHANGED   Details  acetaminophen (TYLENOL) 325 MG tablet Take 325 mg by mouth every 6 (six) hours as needed.    atorvastatin (LIPITOR) 20 MG tablet Take 20 mg by mouth daily.    busPIRone (BUSPAR) 10 MG tablet Take 10 mg by mouth 2 (two) times daily.    levothyroxine (SYNTHROID, LEVOTHROID) 25 MCG tablet Take 25 mcg by mouth daily before breakfast.    mirtazapine (REMERON) 30 MG tablet Take 30 mg by mouth at bedtime.    pramipexole (MIRAPEX) 0.25 MG tablet Take 0.25 mg by mouth at bedtime.    solifenacin (VESICARE) 10 MG tablet Take 10 mg by mouth at bedtime.    topiramate (TOPAMAX) 25 MG tablet Take 25 mg by mouth 2 (two) times daily.       No Known Allergies    The results of significant diagnostics from this hospitalization (including imaging, microbiology, ancillary and laboratory) are listed  below for reference.    Significant Diagnostic Studies: Dg Chest 2 View  11/16/2014   CLINICAL DATA:  Sudden onset of chest and epigastric pain while eating lunch today. Initial encounter.  EXAM: CHEST  2 VIEW  COMPARISON:  None.  FINDINGS: The heart size is normal. There is mild aortic tortuosity. The lungs appear well aerated on the frontal examination. On the lateral view, there is a lesser degree of inspiration with probable mild bibasilar atelectasis. No edema, pleural effusion or pneumothorax. The bones appear unremarkable.  IMPRESSION: No acute cardiopulmonary process.  Aortic tortuosity.   Electronically Signed   By: Carey Bullocks M.D.   On: 11/16/2014 16:12   Dg Esophagus  11/17/2014   CLINICAL DATA:  Epigastric  discomfort with pain and nausea. Anxiety. History of CVA. Initial encounter.  EXAM: ESOPHOGRAM / BARIUM SWALLOW / BARIUM TABLET STUDY  TECHNIQUE: Combined double contrast and single contrast examination performed using thick and thin barium liquid. The patient was observed with fluoroscopy swallowing a 13 mm barium sulphate tablet.  FLUOROSCOPY TIME:  Radiation Exposure Index (as provided by the fluoroscopic device): 259.14 uGy*m2  If the device does not provide the exposure index:  Fluoroscopy Time:  1 minutes and 18 seconds  Number of Acquired Images:  0 non fluoro store images  COMPARISON:  Chest radiographs 11/16/2014  FINDINGS: The patient had some difficulty following the instructions. There is a mildly decreased primary stripping wave with otherwise normal esophageal motility for age. Rapid sequence imaging of the pharynx in the AP and lateral projections demonstrates no abnormalities. There was no laryngeal penetration.  There is a small reducible hiatal hernia. No evidence of mucosal ulceration, stricture or mass. No reflux elicited with the water siphon test. A 13 mm barium tablet was administered and passed without delay into the stomach.  IMPRESSION: No significant findings  identified.  Mild presbyesophagus.   Electronically Signed   By: Carey Bullocks M.D.   On: 11/17/2014 11:46    Microbiology: Recent Results (from the past 240 hour(s))  Urine culture     Status: None (Preliminary result)   Collection Time: 11/16/14  3:54 PM  Result Value Ref Range Status   Specimen Description URINE, CLEAN CATCH  Final   Special Requests NONE  Final   Culture CULTURE REINCUBATED FOR BETTER GROWTH  Final   Report Status PENDING  Incomplete  MRSA PCR Screening     Status: None   Collection Time: 11/16/14 10:26 PM  Result Value Ref Range Status   MRSA by PCR NEGATIVE NEGATIVE Final    Comment:        The GeneXpert MRSA Assay (FDA approved for NASAL specimens only), is one component of a comprehensive MRSA colonization surveillance program. It is not intended to diagnose MRSA infection nor to guide or monitor treatment for MRSA infections.      Labs: Basic Metabolic Panel:  Recent Labs Lab 11/16/14 1552  NA 140  K 3.6  CL 110  CO2 25  GLUCOSE 131*  BUN 15  CREATININE 0.81  CALCIUM 8.7*   Liver Function Tests: No results for input(s): AST, ALT, ALKPHOS, BILITOT, PROT, ALBUMIN in the last 168 hours. No results for input(s): LIPASE, AMYLASE in the last 168 hours. No results for input(s): AMMONIA in the last 168 hours. CBC:  Recent Labs Lab 11/16/14 1552  WBC 5.8  NEUTROABS 3.9  HGB 12.0  HCT 36.4  MCV 89.9  PLT 170   Cardiac Enzymes:  Recent Labs Lab 11/16/14 1552 11/16/14 2228 11/17/14 0129  TROPONINI <0.03 <0.03 <0.03   BNP: BNP (last 3 results)  Recent Labs  11/16/14 1552  BNP 61.1    ProBNP (last 3 results) No results for input(s): PROBNP in the last 8760 hours.  CBG: No results for input(s): GLUCAP in the last 168 hours.     SignedJeralyn Bennett  [facility] 11/18/2014, 7:29 AM

## 2015-05-13 DIAGNOSIS — R4189 Other symptoms and signs involving cognitive functions and awareness: Secondary | ICD-10-CM | POA: Diagnosis not present

## 2015-05-13 DIAGNOSIS — R600 Localized edema: Secondary | ICD-10-CM | POA: Diagnosis not present

## 2015-05-13 DIAGNOSIS — N3281 Overactive bladder: Secondary | ICD-10-CM | POA: Diagnosis not present

## 2015-05-13 DIAGNOSIS — M810 Age-related osteoporosis without current pathological fracture: Secondary | ICD-10-CM | POA: Diagnosis not present

## 2015-05-13 DIAGNOSIS — G250 Essential tremor: Secondary | ICD-10-CM | POA: Diagnosis not present

## 2015-05-13 DIAGNOSIS — N289 Disorder of kidney and ureter, unspecified: Secondary | ICD-10-CM | POA: Diagnosis not present

## 2015-05-18 DIAGNOSIS — Z6835 Body mass index (BMI) 35.0-35.9, adult: Secondary | ICD-10-CM | POA: Diagnosis not present

## 2015-05-18 DIAGNOSIS — E669 Obesity, unspecified: Secondary | ICD-10-CM | POA: Diagnosis not present

## 2015-05-18 DIAGNOSIS — Z9181 History of falling: Secondary | ICD-10-CM | POA: Diagnosis not present

## 2015-05-18 DIAGNOSIS — G3184 Mild cognitive impairment, so stated: Secondary | ICD-10-CM | POA: Diagnosis not present

## 2015-05-18 DIAGNOSIS — R279 Unspecified lack of coordination: Secondary | ICD-10-CM | POA: Diagnosis not present

## 2015-05-18 DIAGNOSIS — G250 Essential tremor: Secondary | ICD-10-CM | POA: Diagnosis not present

## 2015-05-18 DIAGNOSIS — F419 Anxiety disorder, unspecified: Secondary | ICD-10-CM | POA: Diagnosis not present

## 2015-05-18 DIAGNOSIS — M810 Age-related osteoporosis without current pathological fracture: Secondary | ICD-10-CM | POA: Diagnosis not present

## 2015-06-01 DIAGNOSIS — Z6835 Body mass index (BMI) 35.0-35.9, adult: Secondary | ICD-10-CM | POA: Diagnosis not present

## 2015-06-01 DIAGNOSIS — Z9181 History of falling: Secondary | ICD-10-CM | POA: Diagnosis not present

## 2015-06-01 DIAGNOSIS — R279 Unspecified lack of coordination: Secondary | ICD-10-CM | POA: Diagnosis not present

## 2015-06-01 DIAGNOSIS — F419 Anxiety disorder, unspecified: Secondary | ICD-10-CM | POA: Diagnosis not present

## 2015-06-01 DIAGNOSIS — G3184 Mild cognitive impairment, so stated: Secondary | ICD-10-CM | POA: Diagnosis not present

## 2015-06-01 DIAGNOSIS — M810 Age-related osteoporosis without current pathological fracture: Secondary | ICD-10-CM | POA: Diagnosis not present

## 2015-06-01 DIAGNOSIS — E669 Obesity, unspecified: Secondary | ICD-10-CM | POA: Diagnosis not present

## 2015-06-01 DIAGNOSIS — G250 Essential tremor: Secondary | ICD-10-CM | POA: Diagnosis not present

## 2015-06-09 DIAGNOSIS — M810 Age-related osteoporosis without current pathological fracture: Secondary | ICD-10-CM | POA: Diagnosis not present

## 2015-06-09 DIAGNOSIS — R279 Unspecified lack of coordination: Secondary | ICD-10-CM | POA: Diagnosis not present

## 2015-06-09 DIAGNOSIS — Z9181 History of falling: Secondary | ICD-10-CM | POA: Diagnosis not present

## 2015-06-09 DIAGNOSIS — F419 Anxiety disorder, unspecified: Secondary | ICD-10-CM | POA: Diagnosis not present

## 2015-06-09 DIAGNOSIS — G250 Essential tremor: Secondary | ICD-10-CM | POA: Diagnosis not present

## 2015-06-09 DIAGNOSIS — Z6835 Body mass index (BMI) 35.0-35.9, adult: Secondary | ICD-10-CM | POA: Diagnosis not present

## 2015-06-09 DIAGNOSIS — E669 Obesity, unspecified: Secondary | ICD-10-CM | POA: Diagnosis not present

## 2015-06-09 DIAGNOSIS — G3184 Mild cognitive impairment, so stated: Secondary | ICD-10-CM | POA: Diagnosis not present

## 2015-06-23 DIAGNOSIS — N289 Disorder of kidney and ureter, unspecified: Secondary | ICD-10-CM | POA: Diagnosis not present

## 2015-06-23 DIAGNOSIS — Z9181 History of falling: Secondary | ICD-10-CM | POA: Diagnosis not present

## 2015-06-23 DIAGNOSIS — Z6835 Body mass index (BMI) 35.0-35.9, adult: Secondary | ICD-10-CM | POA: Diagnosis not present

## 2015-06-23 DIAGNOSIS — R279 Unspecified lack of coordination: Secondary | ICD-10-CM | POA: Diagnosis not present

## 2015-06-23 DIAGNOSIS — M79604 Pain in right leg: Secondary | ICD-10-CM | POA: Diagnosis not present

## 2015-06-23 DIAGNOSIS — L03115 Cellulitis of right lower limb: Secondary | ICD-10-CM | POA: Diagnosis not present

## 2015-06-23 DIAGNOSIS — G250 Essential tremor: Secondary | ICD-10-CM | POA: Diagnosis not present

## 2015-06-23 DIAGNOSIS — M810 Age-related osteoporosis without current pathological fracture: Secondary | ICD-10-CM | POA: Diagnosis not present

## 2015-06-23 DIAGNOSIS — L219 Seborrheic dermatitis, unspecified: Secondary | ICD-10-CM | POA: Diagnosis not present

## 2015-06-23 DIAGNOSIS — F419 Anxiety disorder, unspecified: Secondary | ICD-10-CM | POA: Diagnosis not present

## 2015-06-23 DIAGNOSIS — E669 Obesity, unspecified: Secondary | ICD-10-CM | POA: Diagnosis not present

## 2015-06-23 DIAGNOSIS — G3184 Mild cognitive impairment, so stated: Secondary | ICD-10-CM | POA: Diagnosis not present

## 2015-06-24 DIAGNOSIS — R600 Localized edema: Secondary | ICD-10-CM | POA: Diagnosis not present

## 2015-06-24 DIAGNOSIS — M79604 Pain in right leg: Secondary | ICD-10-CM | POA: Diagnosis not present

## 2015-06-27 DIAGNOSIS — L03115 Cellulitis of right lower limb: Secondary | ICD-10-CM | POA: Diagnosis not present

## 2015-07-12 DIAGNOSIS — R251 Tremor, unspecified: Secondary | ICD-10-CM | POA: Diagnosis not present

## 2015-07-12 DIAGNOSIS — G250 Essential tremor: Secondary | ICD-10-CM | POA: Diagnosis not present

## 2015-07-15 DIAGNOSIS — G3184 Mild cognitive impairment, so stated: Secondary | ICD-10-CM | POA: Diagnosis not present

## 2015-07-15 DIAGNOSIS — E669 Obesity, unspecified: Secondary | ICD-10-CM | POA: Diagnosis not present

## 2015-07-15 DIAGNOSIS — M810 Age-related osteoporosis without current pathological fracture: Secondary | ICD-10-CM | POA: Diagnosis not present

## 2015-07-15 DIAGNOSIS — G250 Essential tremor: Secondary | ICD-10-CM | POA: Diagnosis not present

## 2015-07-15 DIAGNOSIS — F419 Anxiety disorder, unspecified: Secondary | ICD-10-CM | POA: Diagnosis not present

## 2015-08-01 DIAGNOSIS — Z961 Presence of intraocular lens: Secondary | ICD-10-CM | POA: Diagnosis not present

## 2015-08-07 DIAGNOSIS — N390 Urinary tract infection, site not specified: Secondary | ICD-10-CM | POA: Diagnosis not present

## 2015-08-07 DIAGNOSIS — R300 Dysuria: Secondary | ICD-10-CM | POA: Diagnosis not present

## 2015-08-07 DIAGNOSIS — F329 Major depressive disorder, single episode, unspecified: Secondary | ICD-10-CM | POA: Diagnosis not present

## 2015-08-07 DIAGNOSIS — R410 Disorientation, unspecified: Secondary | ICD-10-CM | POA: Diagnosis not present

## 2015-08-07 DIAGNOSIS — A499 Bacterial infection, unspecified: Secondary | ICD-10-CM | POA: Diagnosis not present

## 2015-08-19 DIAGNOSIS — G250 Essential tremor: Secondary | ICD-10-CM | POA: Diagnosis not present

## 2015-08-19 DIAGNOSIS — F419 Anxiety disorder, unspecified: Secondary | ICD-10-CM | POA: Diagnosis not present

## 2015-08-19 DIAGNOSIS — E669 Obesity, unspecified: Secondary | ICD-10-CM | POA: Diagnosis not present

## 2015-08-19 DIAGNOSIS — M810 Age-related osteoporosis without current pathological fracture: Secondary | ICD-10-CM | POA: Diagnosis not present

## 2015-08-19 DIAGNOSIS — G3184 Mild cognitive impairment, so stated: Secondary | ICD-10-CM | POA: Diagnosis not present

## 2015-08-24 DIAGNOSIS — N3942 Incontinence without sensory awareness: Secondary | ICD-10-CM | POA: Diagnosis not present

## 2015-08-24 DIAGNOSIS — R8290 Unspecified abnormal findings in urine: Secondary | ICD-10-CM | POA: Diagnosis not present

## 2015-08-24 DIAGNOSIS — R3915 Urgency of urination: Secondary | ICD-10-CM | POA: Diagnosis not present

## 2015-08-30 DIAGNOSIS — F329 Major depressive disorder, single episode, unspecified: Secondary | ICD-10-CM | POA: Diagnosis not present

## 2015-08-30 DIAGNOSIS — G250 Essential tremor: Secondary | ICD-10-CM | POA: Diagnosis not present

## 2015-08-30 DIAGNOSIS — G43009 Migraine without aura, not intractable, without status migrainosus: Secondary | ICD-10-CM | POA: Diagnosis not present

## 2015-08-30 DIAGNOSIS — F419 Anxiety disorder, unspecified: Secondary | ICD-10-CM | POA: Diagnosis not present

## 2015-08-30 DIAGNOSIS — R4189 Other symptoms and signs involving cognitive functions and awareness: Secondary | ICD-10-CM | POA: Diagnosis not present

## 2015-09-07 DIAGNOSIS — I613 Nontraumatic intracerebral hemorrhage in brain stem: Secondary | ICD-10-CM | POA: Diagnosis not present

## 2015-09-07 DIAGNOSIS — Z01818 Encounter for other preprocedural examination: Secondary | ICD-10-CM | POA: Diagnosis not present

## 2015-09-07 DIAGNOSIS — Z8673 Personal history of transient ischemic attack (TIA), and cerebral infarction without residual deficits: Secondary | ICD-10-CM | POA: Diagnosis not present

## 2015-09-07 DIAGNOSIS — R410 Disorientation, unspecified: Secondary | ICD-10-CM | POA: Diagnosis not present

## 2015-09-07 DIAGNOSIS — F0390 Unspecified dementia without behavioral disturbance: Secondary | ICD-10-CM | POA: Diagnosis not present

## 2015-09-07 DIAGNOSIS — I629 Nontraumatic intracranial hemorrhage, unspecified: Secondary | ICD-10-CM | POA: Diagnosis not present

## 2015-09-12 DIAGNOSIS — I680 Cerebral amyloid angiopathy: Secondary | ICD-10-CM | POA: Diagnosis not present

## 2015-09-12 DIAGNOSIS — S06340A Traumatic hemorrhage of right cerebrum without loss of consciousness, initial encounter: Secondary | ICD-10-CM | POA: Diagnosis not present

## 2015-09-23 DIAGNOSIS — I619 Nontraumatic intracerebral hemorrhage, unspecified: Secondary | ICD-10-CM | POA: Diagnosis not present

## 2015-09-23 DIAGNOSIS — F329 Major depressive disorder, single episode, unspecified: Secondary | ICD-10-CM | POA: Diagnosis not present

## 2015-09-23 DIAGNOSIS — X58XXXA Exposure to other specified factors, initial encounter: Secondary | ICD-10-CM | POA: Diagnosis not present

## 2015-09-23 DIAGNOSIS — R9082 White matter disease, unspecified: Secondary | ICD-10-CM | POA: Diagnosis not present

## 2015-09-23 DIAGNOSIS — G319 Degenerative disease of nervous system, unspecified: Secondary | ICD-10-CM | POA: Diagnosis not present

## 2015-09-23 DIAGNOSIS — S06304A Unspecified focal traumatic brain injury with loss of consciousness of 6 hours to 24 hours, initial encounter: Secondary | ICD-10-CM | POA: Diagnosis not present

## 2015-09-23 DIAGNOSIS — R413 Other amnesia: Secondary | ICD-10-CM | POA: Diagnosis not present

## 2015-09-28 DIAGNOSIS — R4189 Other symptoms and signs involving cognitive functions and awareness: Secondary | ICD-10-CM | POA: Diagnosis not present

## 2015-09-28 DIAGNOSIS — Z79899 Other long term (current) drug therapy: Secondary | ICD-10-CM | POA: Diagnosis not present

## 2015-09-28 DIAGNOSIS — E039 Hypothyroidism, unspecified: Secondary | ICD-10-CM | POA: Diagnosis not present

## 2015-09-28 DIAGNOSIS — R600 Localized edema: Secondary | ICD-10-CM | POA: Diagnosis not present

## 2015-09-28 DIAGNOSIS — E785 Hyperlipidemia, unspecified: Secondary | ICD-10-CM | POA: Diagnosis not present

## 2015-10-05 DIAGNOSIS — F329 Major depressive disorder, single episode, unspecified: Secondary | ICD-10-CM | POA: Diagnosis not present

## 2015-10-05 DIAGNOSIS — F419 Anxiety disorder, unspecified: Secondary | ICD-10-CM | POA: Diagnosis not present

## 2015-10-27 DIAGNOSIS — R600 Localized edema: Secondary | ICD-10-CM | POA: Diagnosis not present

## 2015-10-27 DIAGNOSIS — Z23 Encounter for immunization: Secondary | ICD-10-CM | POA: Diagnosis not present

## 2015-12-09 DIAGNOSIS — M810 Age-related osteoporosis without current pathological fracture: Secondary | ICD-10-CM | POA: Diagnosis not present

## 2015-12-09 DIAGNOSIS — Z79899 Other long term (current) drug therapy: Secondary | ICD-10-CM | POA: Diagnosis not present

## 2015-12-22 DIAGNOSIS — R262 Difficulty in walking, not elsewhere classified: Secondary | ICD-10-CM | POA: Diagnosis not present

## 2015-12-22 DIAGNOSIS — N3946 Mixed incontinence: Secondary | ICD-10-CM | POA: Diagnosis not present

## 2015-12-22 DIAGNOSIS — R278 Other lack of coordination: Secondary | ICD-10-CM | POA: Diagnosis not present

## 2015-12-22 DIAGNOSIS — M6281 Muscle weakness (generalized): Secondary | ICD-10-CM | POA: Diagnosis not present

## 2015-12-22 DIAGNOSIS — Z9181 History of falling: Secondary | ICD-10-CM | POA: Diagnosis not present

## 2015-12-22 DIAGNOSIS — R260 Ataxic gait: Secondary | ICD-10-CM | POA: Diagnosis not present

## 2015-12-23 DIAGNOSIS — R262 Difficulty in walking, not elsewhere classified: Secondary | ICD-10-CM | POA: Diagnosis not present

## 2015-12-23 DIAGNOSIS — Z9181 History of falling: Secondary | ICD-10-CM | POA: Diagnosis not present

## 2015-12-23 DIAGNOSIS — N3946 Mixed incontinence: Secondary | ICD-10-CM | POA: Diagnosis not present

## 2015-12-23 DIAGNOSIS — R260 Ataxic gait: Secondary | ICD-10-CM | POA: Diagnosis not present

## 2015-12-23 DIAGNOSIS — R278 Other lack of coordination: Secondary | ICD-10-CM | POA: Diagnosis not present

## 2015-12-23 DIAGNOSIS — M6281 Muscle weakness (generalized): Secondary | ICD-10-CM | POA: Diagnosis not present

## 2015-12-26 DIAGNOSIS — R21 Rash and other nonspecific skin eruption: Secondary | ICD-10-CM | POA: Diagnosis not present

## 2015-12-26 DIAGNOSIS — M6281 Muscle weakness (generalized): Secondary | ICD-10-CM | POA: Diagnosis not present

## 2015-12-26 DIAGNOSIS — R262 Difficulty in walking, not elsewhere classified: Secondary | ICD-10-CM | POA: Diagnosis not present

## 2015-12-26 DIAGNOSIS — R260 Ataxic gait: Secondary | ICD-10-CM | POA: Diagnosis not present

## 2015-12-26 DIAGNOSIS — N390 Urinary tract infection, site not specified: Secondary | ICD-10-CM | POA: Diagnosis not present

## 2015-12-26 DIAGNOSIS — Z9181 History of falling: Secondary | ICD-10-CM | POA: Diagnosis not present

## 2015-12-26 DIAGNOSIS — A499 Bacterial infection, unspecified: Secondary | ICD-10-CM | POA: Diagnosis not present

## 2015-12-26 DIAGNOSIS — N3946 Mixed incontinence: Secondary | ICD-10-CM | POA: Diagnosis not present

## 2015-12-26 DIAGNOSIS — R109 Unspecified abdominal pain: Secondary | ICD-10-CM | POA: Diagnosis not present

## 2015-12-26 DIAGNOSIS — R278 Other lack of coordination: Secondary | ICD-10-CM | POA: Diagnosis not present

## 2015-12-27 DIAGNOSIS — Z9181 History of falling: Secondary | ICD-10-CM | POA: Diagnosis not present

## 2015-12-27 DIAGNOSIS — N3946 Mixed incontinence: Secondary | ICD-10-CM | POA: Diagnosis not present

## 2015-12-27 DIAGNOSIS — R262 Difficulty in walking, not elsewhere classified: Secondary | ICD-10-CM | POA: Diagnosis not present

## 2015-12-27 DIAGNOSIS — M6281 Muscle weakness (generalized): Secondary | ICD-10-CM | POA: Diagnosis not present

## 2015-12-27 DIAGNOSIS — R278 Other lack of coordination: Secondary | ICD-10-CM | POA: Diagnosis not present

## 2015-12-27 DIAGNOSIS — R260 Ataxic gait: Secondary | ICD-10-CM | POA: Diagnosis not present

## 2015-12-28 DIAGNOSIS — Z6837 Body mass index (BMI) 37.0-37.9, adult: Secondary | ICD-10-CM | POA: Diagnosis not present

## 2015-12-28 DIAGNOSIS — N12 Tubulo-interstitial nephritis, not specified as acute or chronic: Secondary | ICD-10-CM | POA: Diagnosis not present

## 2015-12-28 DIAGNOSIS — E039 Hypothyroidism, unspecified: Secondary | ICD-10-CM | POA: Diagnosis not present

## 2015-12-28 DIAGNOSIS — F411 Generalized anxiety disorder: Secondary | ICD-10-CM | POA: Diagnosis not present

## 2015-12-28 DIAGNOSIS — M549 Dorsalgia, unspecified: Secondary | ICD-10-CM | POA: Diagnosis not present

## 2015-12-28 DIAGNOSIS — R109 Unspecified abdominal pain: Secondary | ICD-10-CM | POA: Diagnosis not present

## 2015-12-28 DIAGNOSIS — F329 Major depressive disorder, single episode, unspecified: Secondary | ICD-10-CM | POA: Diagnosis not present

## 2015-12-28 DIAGNOSIS — Z8673 Personal history of transient ischemic attack (TIA), and cerebral infarction without residual deficits: Secondary | ICD-10-CM | POA: Diagnosis not present

## 2015-12-28 DIAGNOSIS — N3281 Overactive bladder: Secondary | ICD-10-CM | POA: Diagnosis not present

## 2015-12-28 DIAGNOSIS — I671 Cerebral aneurysm, nonruptured: Secondary | ICD-10-CM | POA: Diagnosis not present

## 2015-12-28 DIAGNOSIS — G3184 Mild cognitive impairment, so stated: Secondary | ICD-10-CM | POA: Diagnosis not present

## 2015-12-28 DIAGNOSIS — R319 Hematuria, unspecified: Secondary | ICD-10-CM | POA: Diagnosis not present

## 2015-12-28 DIAGNOSIS — R609 Edema, unspecified: Secondary | ICD-10-CM | POA: Diagnosis not present

## 2015-12-28 DIAGNOSIS — N10 Acute tubulo-interstitial nephritis: Secondary | ICD-10-CM | POA: Diagnosis not present

## 2015-12-28 DIAGNOSIS — N390 Urinary tract infection, site not specified: Secondary | ICD-10-CM | POA: Diagnosis not present

## 2015-12-28 DIAGNOSIS — F323 Major depressive disorder, single episode, severe with psychotic features: Secondary | ICD-10-CM | POA: Diagnosis not present

## 2015-12-28 DIAGNOSIS — Z7983 Long term (current) use of bisphosphonates: Secondary | ICD-10-CM | POA: Diagnosis not present

## 2015-12-28 DIAGNOSIS — E669 Obesity, unspecified: Secondary | ICD-10-CM | POA: Diagnosis not present

## 2015-12-28 DIAGNOSIS — G250 Essential tremor: Secondary | ICD-10-CM | POA: Diagnosis not present

## 2015-12-28 DIAGNOSIS — M810 Age-related osteoporosis without current pathological fracture: Secondary | ICD-10-CM | POA: Diagnosis not present

## 2015-12-28 DIAGNOSIS — R509 Fever, unspecified: Secondary | ICD-10-CM | POA: Diagnosis not present

## 2015-12-28 DIAGNOSIS — F0390 Unspecified dementia without behavioral disturbance: Secondary | ICD-10-CM | POA: Diagnosis not present

## 2015-12-28 DIAGNOSIS — E785 Hyperlipidemia, unspecified: Secondary | ICD-10-CM | POA: Diagnosis not present

## 2015-12-29 DIAGNOSIS — F329 Major depressive disorder, single episode, unspecified: Secondary | ICD-10-CM | POA: Diagnosis not present

## 2015-12-29 DIAGNOSIS — F0390 Unspecified dementia without behavioral disturbance: Secondary | ICD-10-CM | POA: Diagnosis not present

## 2015-12-29 DIAGNOSIS — I358 Other nonrheumatic aortic valve disorders: Secondary | ICD-10-CM | POA: Diagnosis not present

## 2015-12-29 DIAGNOSIS — R600 Localized edema: Secondary | ICD-10-CM | POA: Diagnosis not present

## 2015-12-29 DIAGNOSIS — E039 Hypothyroidism, unspecified: Secondary | ICD-10-CM | POA: Diagnosis not present

## 2015-12-29 DIAGNOSIS — R251 Tremor, unspecified: Secondary | ICD-10-CM | POA: Diagnosis not present

## 2015-12-29 DIAGNOSIS — N10 Acute tubulo-interstitial nephritis: Secondary | ICD-10-CM | POA: Diagnosis not present

## 2015-12-29 DIAGNOSIS — E785 Hyperlipidemia, unspecified: Secondary | ICD-10-CM | POA: Diagnosis not present

## 2016-01-03 DIAGNOSIS — R262 Difficulty in walking, not elsewhere classified: Secondary | ICD-10-CM | POA: Diagnosis not present

## 2016-01-03 DIAGNOSIS — R260 Ataxic gait: Secondary | ICD-10-CM | POA: Diagnosis not present

## 2016-01-03 DIAGNOSIS — Z9181 History of falling: Secondary | ICD-10-CM | POA: Diagnosis not present

## 2016-01-03 DIAGNOSIS — M6281 Muscle weakness (generalized): Secondary | ICD-10-CM | POA: Diagnosis not present

## 2016-01-03 DIAGNOSIS — R278 Other lack of coordination: Secondary | ICD-10-CM | POA: Diagnosis not present

## 2016-01-03 DIAGNOSIS — N3946 Mixed incontinence: Secondary | ICD-10-CM | POA: Diagnosis not present

## 2016-01-04 DIAGNOSIS — R262 Difficulty in walking, not elsewhere classified: Secondary | ICD-10-CM | POA: Diagnosis not present

## 2016-01-04 DIAGNOSIS — N3946 Mixed incontinence: Secondary | ICD-10-CM | POA: Diagnosis not present

## 2016-01-04 DIAGNOSIS — Z9181 History of falling: Secondary | ICD-10-CM | POA: Diagnosis not present

## 2016-01-04 DIAGNOSIS — R278 Other lack of coordination: Secondary | ICD-10-CM | POA: Diagnosis not present

## 2016-01-04 DIAGNOSIS — R260 Ataxic gait: Secondary | ICD-10-CM | POA: Diagnosis not present

## 2016-01-04 DIAGNOSIS — M6281 Muscle weakness (generalized): Secondary | ICD-10-CM | POA: Diagnosis not present

## 2016-01-05 DIAGNOSIS — R260 Ataxic gait: Secondary | ICD-10-CM | POA: Diagnosis not present

## 2016-01-05 DIAGNOSIS — R278 Other lack of coordination: Secondary | ICD-10-CM | POA: Diagnosis not present

## 2016-01-05 DIAGNOSIS — N3946 Mixed incontinence: Secondary | ICD-10-CM | POA: Diagnosis not present

## 2016-01-05 DIAGNOSIS — M6281 Muscle weakness (generalized): Secondary | ICD-10-CM | POA: Diagnosis not present

## 2016-01-05 DIAGNOSIS — R262 Difficulty in walking, not elsewhere classified: Secondary | ICD-10-CM | POA: Diagnosis not present

## 2016-01-05 DIAGNOSIS — Z9181 History of falling: Secondary | ICD-10-CM | POA: Diagnosis not present

## 2016-01-06 DIAGNOSIS — N3946 Mixed incontinence: Secondary | ICD-10-CM | POA: Diagnosis not present

## 2016-01-06 DIAGNOSIS — R260 Ataxic gait: Secondary | ICD-10-CM | POA: Diagnosis not present

## 2016-01-06 DIAGNOSIS — R278 Other lack of coordination: Secondary | ICD-10-CM | POA: Diagnosis not present

## 2016-01-06 DIAGNOSIS — Z9181 History of falling: Secondary | ICD-10-CM | POA: Diagnosis not present

## 2016-01-06 DIAGNOSIS — M6281 Muscle weakness (generalized): Secondary | ICD-10-CM | POA: Diagnosis not present

## 2016-01-06 DIAGNOSIS — R262 Difficulty in walking, not elsewhere classified: Secondary | ICD-10-CM | POA: Diagnosis not present

## 2016-01-09 DIAGNOSIS — R260 Ataxic gait: Secondary | ICD-10-CM | POA: Diagnosis not present

## 2016-01-09 DIAGNOSIS — Z23 Encounter for immunization: Secondary | ICD-10-CM | POA: Diagnosis not present

## 2016-01-09 DIAGNOSIS — Z09 Encounter for follow-up examination after completed treatment for conditions other than malignant neoplasm: Secondary | ICD-10-CM | POA: Diagnosis not present

## 2016-01-09 DIAGNOSIS — M6281 Muscle weakness (generalized): Secondary | ICD-10-CM | POA: Diagnosis not present

## 2016-01-09 DIAGNOSIS — N3946 Mixed incontinence: Secondary | ICD-10-CM | POA: Diagnosis not present

## 2016-01-09 DIAGNOSIS — N12 Tubulo-interstitial nephritis, not specified as acute or chronic: Secondary | ICD-10-CM | POA: Diagnosis not present

## 2016-01-09 DIAGNOSIS — R262 Difficulty in walking, not elsewhere classified: Secondary | ICD-10-CM | POA: Diagnosis not present

## 2016-01-09 DIAGNOSIS — Z9181 History of falling: Secondary | ICD-10-CM | POA: Diagnosis not present

## 2016-01-09 DIAGNOSIS — M810 Age-related osteoporosis without current pathological fracture: Secondary | ICD-10-CM | POA: Diagnosis not present

## 2016-01-09 DIAGNOSIS — M546 Pain in thoracic spine: Secondary | ICD-10-CM | POA: Diagnosis not present

## 2016-01-09 DIAGNOSIS — R278 Other lack of coordination: Secondary | ICD-10-CM | POA: Diagnosis not present

## 2016-01-10 DIAGNOSIS — N3946 Mixed incontinence: Secondary | ICD-10-CM | POA: Diagnosis not present

## 2016-01-10 DIAGNOSIS — R262 Difficulty in walking, not elsewhere classified: Secondary | ICD-10-CM | POA: Diagnosis not present

## 2016-01-10 DIAGNOSIS — R260 Ataxic gait: Secondary | ICD-10-CM | POA: Diagnosis not present

## 2016-01-10 DIAGNOSIS — R278 Other lack of coordination: Secondary | ICD-10-CM | POA: Diagnosis not present

## 2016-01-10 DIAGNOSIS — Z9181 History of falling: Secondary | ICD-10-CM | POA: Diagnosis not present

## 2016-01-10 DIAGNOSIS — M6281 Muscle weakness (generalized): Secondary | ICD-10-CM | POA: Diagnosis not present

## 2016-01-11 DIAGNOSIS — M6281 Muscle weakness (generalized): Secondary | ICD-10-CM | POA: Diagnosis not present

## 2016-01-11 DIAGNOSIS — R278 Other lack of coordination: Secondary | ICD-10-CM | POA: Diagnosis not present

## 2016-01-11 DIAGNOSIS — N3946 Mixed incontinence: Secondary | ICD-10-CM | POA: Diagnosis not present

## 2016-01-11 DIAGNOSIS — R262 Difficulty in walking, not elsewhere classified: Secondary | ICD-10-CM | POA: Diagnosis not present

## 2016-01-11 DIAGNOSIS — Z9181 History of falling: Secondary | ICD-10-CM | POA: Diagnosis not present

## 2016-01-11 DIAGNOSIS — R260 Ataxic gait: Secondary | ICD-10-CM | POA: Diagnosis not present

## 2016-01-12 DIAGNOSIS — R278 Other lack of coordination: Secondary | ICD-10-CM | POA: Diagnosis not present

## 2016-01-12 DIAGNOSIS — Z9181 History of falling: Secondary | ICD-10-CM | POA: Diagnosis not present

## 2016-01-12 DIAGNOSIS — N3946 Mixed incontinence: Secondary | ICD-10-CM | POA: Diagnosis not present

## 2016-01-12 DIAGNOSIS — R262 Difficulty in walking, not elsewhere classified: Secondary | ICD-10-CM | POA: Diagnosis not present

## 2016-01-12 DIAGNOSIS — M6281 Muscle weakness (generalized): Secondary | ICD-10-CM | POA: Diagnosis not present

## 2016-01-12 DIAGNOSIS — R260 Ataxic gait: Secondary | ICD-10-CM | POA: Diagnosis not present

## 2016-01-16 DIAGNOSIS — N3946 Mixed incontinence: Secondary | ICD-10-CM | POA: Diagnosis not present

## 2016-01-16 DIAGNOSIS — R260 Ataxic gait: Secondary | ICD-10-CM | POA: Diagnosis not present

## 2016-01-16 DIAGNOSIS — M6281 Muscle weakness (generalized): Secondary | ICD-10-CM | POA: Diagnosis not present

## 2016-01-16 DIAGNOSIS — R262 Difficulty in walking, not elsewhere classified: Secondary | ICD-10-CM | POA: Diagnosis not present

## 2016-01-16 DIAGNOSIS — R278 Other lack of coordination: Secondary | ICD-10-CM | POA: Diagnosis not present

## 2016-01-16 DIAGNOSIS — Z9181 History of falling: Secondary | ICD-10-CM | POA: Diagnosis not present

## 2016-01-17 DIAGNOSIS — R262 Difficulty in walking, not elsewhere classified: Secondary | ICD-10-CM | POA: Diagnosis not present

## 2016-01-17 DIAGNOSIS — N3946 Mixed incontinence: Secondary | ICD-10-CM | POA: Diagnosis not present

## 2016-01-17 DIAGNOSIS — Z9181 History of falling: Secondary | ICD-10-CM | POA: Diagnosis not present

## 2016-01-17 DIAGNOSIS — R278 Other lack of coordination: Secondary | ICD-10-CM | POA: Diagnosis not present

## 2016-01-17 DIAGNOSIS — M6281 Muscle weakness (generalized): Secondary | ICD-10-CM | POA: Diagnosis not present

## 2016-01-17 DIAGNOSIS — R260 Ataxic gait: Secondary | ICD-10-CM | POA: Diagnosis not present

## 2016-01-19 DIAGNOSIS — N3946 Mixed incontinence: Secondary | ICD-10-CM | POA: Diagnosis not present

## 2016-01-19 DIAGNOSIS — R262 Difficulty in walking, not elsewhere classified: Secondary | ICD-10-CM | POA: Diagnosis not present

## 2016-01-19 DIAGNOSIS — Z9181 History of falling: Secondary | ICD-10-CM | POA: Diagnosis not present

## 2016-01-19 DIAGNOSIS — R278 Other lack of coordination: Secondary | ICD-10-CM | POA: Diagnosis not present

## 2016-01-19 DIAGNOSIS — R260 Ataxic gait: Secondary | ICD-10-CM | POA: Diagnosis not present

## 2016-01-19 DIAGNOSIS — M6281 Muscle weakness (generalized): Secondary | ICD-10-CM | POA: Diagnosis not present

## 2016-01-23 DIAGNOSIS — R260 Ataxic gait: Secondary | ICD-10-CM | POA: Diagnosis not present

## 2016-01-23 DIAGNOSIS — R262 Difficulty in walking, not elsewhere classified: Secondary | ICD-10-CM | POA: Diagnosis not present

## 2016-01-23 DIAGNOSIS — M6281 Muscle weakness (generalized): Secondary | ICD-10-CM | POA: Diagnosis not present

## 2016-01-23 DIAGNOSIS — R278 Other lack of coordination: Secondary | ICD-10-CM | POA: Diagnosis not present

## 2016-01-23 DIAGNOSIS — N3946 Mixed incontinence: Secondary | ICD-10-CM | POA: Diagnosis not present

## 2016-01-23 DIAGNOSIS — Z9181 History of falling: Secondary | ICD-10-CM | POA: Diagnosis not present

## 2016-01-24 DIAGNOSIS — N3946 Mixed incontinence: Secondary | ICD-10-CM | POA: Diagnosis not present

## 2016-01-24 DIAGNOSIS — R278 Other lack of coordination: Secondary | ICD-10-CM | POA: Diagnosis not present

## 2016-01-24 DIAGNOSIS — M6281 Muscle weakness (generalized): Secondary | ICD-10-CM | POA: Diagnosis not present

## 2016-01-24 DIAGNOSIS — R260 Ataxic gait: Secondary | ICD-10-CM | POA: Diagnosis not present

## 2016-01-24 DIAGNOSIS — R262 Difficulty in walking, not elsewhere classified: Secondary | ICD-10-CM | POA: Diagnosis not present

## 2016-01-24 DIAGNOSIS — Z9181 History of falling: Secondary | ICD-10-CM | POA: Diagnosis not present

## 2016-01-25 DIAGNOSIS — M6281 Muscle weakness (generalized): Secondary | ICD-10-CM | POA: Diagnosis not present

## 2016-01-25 DIAGNOSIS — R260 Ataxic gait: Secondary | ICD-10-CM | POA: Diagnosis not present

## 2016-01-25 DIAGNOSIS — N3946 Mixed incontinence: Secondary | ICD-10-CM | POA: Diagnosis not present

## 2016-01-25 DIAGNOSIS — R262 Difficulty in walking, not elsewhere classified: Secondary | ICD-10-CM | POA: Diagnosis not present

## 2016-01-25 DIAGNOSIS — R278 Other lack of coordination: Secondary | ICD-10-CM | POA: Diagnosis not present

## 2016-01-25 DIAGNOSIS — Z9181 History of falling: Secondary | ICD-10-CM | POA: Diagnosis not present

## 2016-01-26 DIAGNOSIS — M6281 Muscle weakness (generalized): Secondary | ICD-10-CM | POA: Diagnosis not present

## 2016-01-26 DIAGNOSIS — R278 Other lack of coordination: Secondary | ICD-10-CM | POA: Diagnosis not present

## 2016-01-26 DIAGNOSIS — N3946 Mixed incontinence: Secondary | ICD-10-CM | POA: Diagnosis not present

## 2016-01-26 DIAGNOSIS — R262 Difficulty in walking, not elsewhere classified: Secondary | ICD-10-CM | POA: Diagnosis not present

## 2016-01-26 DIAGNOSIS — Z9181 History of falling: Secondary | ICD-10-CM | POA: Diagnosis not present

## 2016-01-26 DIAGNOSIS — R260 Ataxic gait: Secondary | ICD-10-CM | POA: Diagnosis not present

## 2016-01-30 DIAGNOSIS — Z9181 History of falling: Secondary | ICD-10-CM | POA: Diagnosis not present

## 2016-01-30 DIAGNOSIS — M6281 Muscle weakness (generalized): Secondary | ICD-10-CM | POA: Diagnosis not present

## 2016-01-30 DIAGNOSIS — R260 Ataxic gait: Secondary | ICD-10-CM | POA: Diagnosis not present

## 2016-01-30 DIAGNOSIS — R278 Other lack of coordination: Secondary | ICD-10-CM | POA: Diagnosis not present

## 2016-01-30 DIAGNOSIS — N3946 Mixed incontinence: Secondary | ICD-10-CM | POA: Diagnosis not present

## 2016-01-30 DIAGNOSIS — R262 Difficulty in walking, not elsewhere classified: Secondary | ICD-10-CM | POA: Diagnosis not present

## 2016-01-31 DIAGNOSIS — N3946 Mixed incontinence: Secondary | ICD-10-CM | POA: Diagnosis not present

## 2016-01-31 DIAGNOSIS — Z9181 History of falling: Secondary | ICD-10-CM | POA: Diagnosis not present

## 2016-01-31 DIAGNOSIS — R260 Ataxic gait: Secondary | ICD-10-CM | POA: Diagnosis not present

## 2016-01-31 DIAGNOSIS — M6281 Muscle weakness (generalized): Secondary | ICD-10-CM | POA: Diagnosis not present

## 2016-01-31 DIAGNOSIS — R262 Difficulty in walking, not elsewhere classified: Secondary | ICD-10-CM | POA: Diagnosis not present

## 2016-01-31 DIAGNOSIS — R278 Other lack of coordination: Secondary | ICD-10-CM | POA: Diagnosis not present

## 2016-02-02 DIAGNOSIS — R262 Difficulty in walking, not elsewhere classified: Secondary | ICD-10-CM | POA: Diagnosis not present

## 2016-02-02 DIAGNOSIS — Z9181 History of falling: Secondary | ICD-10-CM | POA: Diagnosis not present

## 2016-02-02 DIAGNOSIS — M6281 Muscle weakness (generalized): Secondary | ICD-10-CM | POA: Diagnosis not present

## 2016-02-02 DIAGNOSIS — R278 Other lack of coordination: Secondary | ICD-10-CM | POA: Diagnosis not present

## 2016-02-02 DIAGNOSIS — R260 Ataxic gait: Secondary | ICD-10-CM | POA: Diagnosis not present

## 2016-02-02 DIAGNOSIS — N3946 Mixed incontinence: Secondary | ICD-10-CM | POA: Diagnosis not present

## 2016-02-03 DIAGNOSIS — S3992XA Unspecified injury of lower back, initial encounter: Secondary | ICD-10-CM | POA: Diagnosis not present

## 2016-02-03 DIAGNOSIS — I700 Atherosclerosis of aorta: Secondary | ICD-10-CM | POA: Diagnosis not present

## 2016-02-03 DIAGNOSIS — F419 Anxiety disorder, unspecified: Secondary | ICD-10-CM | POA: Diagnosis not present

## 2016-02-03 DIAGNOSIS — N390 Urinary tract infection, site not specified: Secondary | ICD-10-CM | POA: Diagnosis not present

## 2016-02-03 DIAGNOSIS — G479 Sleep disorder, unspecified: Secondary | ICD-10-CM | POA: Diagnosis not present

## 2016-02-03 DIAGNOSIS — W19XXXA Unspecified fall, initial encounter: Secondary | ICD-10-CM | POA: Diagnosis not present

## 2016-02-03 DIAGNOSIS — T148XXA Other injury of unspecified body region, initial encounter: Secondary | ICD-10-CM | POA: Diagnosis not present

## 2016-02-03 DIAGNOSIS — R079 Chest pain, unspecified: Secondary | ICD-10-CM | POA: Diagnosis not present

## 2016-02-03 DIAGNOSIS — M810 Age-related osteoporosis without current pathological fracture: Secondary | ICD-10-CM | POA: Diagnosis not present

## 2016-02-03 DIAGNOSIS — M546 Pain in thoracic spine: Secondary | ICD-10-CM | POA: Diagnosis not present

## 2016-02-03 DIAGNOSIS — M549 Dorsalgia, unspecified: Secondary | ICD-10-CM | POA: Diagnosis not present

## 2016-02-03 DIAGNOSIS — I6789 Other cerebrovascular disease: Secondary | ICD-10-CM | POA: Diagnosis not present

## 2016-02-03 DIAGNOSIS — M419 Scoliosis, unspecified: Secondary | ICD-10-CM | POA: Diagnosis not present

## 2016-02-03 DIAGNOSIS — R319 Hematuria, unspecified: Secondary | ICD-10-CM | POA: Diagnosis not present

## 2016-02-03 DIAGNOSIS — E039 Hypothyroidism, unspecified: Secondary | ICD-10-CM | POA: Diagnosis not present

## 2016-02-03 DIAGNOSIS — S199XXA Unspecified injury of neck, initial encounter: Secondary | ICD-10-CM | POA: Diagnosis not present

## 2016-02-03 DIAGNOSIS — S0990XA Unspecified injury of head, initial encounter: Secondary | ICD-10-CM | POA: Diagnosis not present

## 2016-02-03 DIAGNOSIS — G319 Degenerative disease of nervous system, unspecified: Secondary | ICD-10-CM | POA: Diagnosis not present

## 2016-02-03 DIAGNOSIS — M545 Low back pain: Secondary | ICD-10-CM | POA: Diagnosis not present

## 2016-02-03 DIAGNOSIS — Z8673 Personal history of transient ischemic attack (TIA), and cerebral infarction without residual deficits: Secondary | ICD-10-CM | POA: Diagnosis not present

## 2016-02-06 DIAGNOSIS — N3946 Mixed incontinence: Secondary | ICD-10-CM | POA: Diagnosis not present

## 2016-02-06 DIAGNOSIS — R278 Other lack of coordination: Secondary | ICD-10-CM | POA: Diagnosis not present

## 2016-02-06 DIAGNOSIS — M6281 Muscle weakness (generalized): Secondary | ICD-10-CM | POA: Diagnosis not present

## 2016-02-06 DIAGNOSIS — R262 Difficulty in walking, not elsewhere classified: Secondary | ICD-10-CM | POA: Diagnosis not present

## 2016-02-06 DIAGNOSIS — Z9181 History of falling: Secondary | ICD-10-CM | POA: Diagnosis not present

## 2016-02-06 DIAGNOSIS — R260 Ataxic gait: Secondary | ICD-10-CM | POA: Diagnosis not present

## 2016-02-07 DIAGNOSIS — M6281 Muscle weakness (generalized): Secondary | ICD-10-CM | POA: Diagnosis not present

## 2016-02-07 DIAGNOSIS — R262 Difficulty in walking, not elsewhere classified: Secondary | ICD-10-CM | POA: Diagnosis not present

## 2016-02-07 DIAGNOSIS — N3946 Mixed incontinence: Secondary | ICD-10-CM | POA: Diagnosis not present

## 2016-02-07 DIAGNOSIS — R278 Other lack of coordination: Secondary | ICD-10-CM | POA: Diagnosis not present

## 2016-02-07 DIAGNOSIS — Z9181 History of falling: Secondary | ICD-10-CM | POA: Diagnosis not present

## 2016-02-07 DIAGNOSIS — R260 Ataxic gait: Secondary | ICD-10-CM | POA: Diagnosis not present

## 2016-02-08 DIAGNOSIS — R262 Difficulty in walking, not elsewhere classified: Secondary | ICD-10-CM | POA: Diagnosis not present

## 2016-02-08 DIAGNOSIS — M6281 Muscle weakness (generalized): Secondary | ICD-10-CM | POA: Diagnosis not present

## 2016-02-08 DIAGNOSIS — R260 Ataxic gait: Secondary | ICD-10-CM | POA: Diagnosis not present

## 2016-02-08 DIAGNOSIS — Z9181 History of falling: Secondary | ICD-10-CM | POA: Diagnosis not present

## 2016-02-08 DIAGNOSIS — N3946 Mixed incontinence: Secondary | ICD-10-CM | POA: Diagnosis not present

## 2016-02-08 DIAGNOSIS — R278 Other lack of coordination: Secondary | ICD-10-CM | POA: Diagnosis not present

## 2016-02-09 DIAGNOSIS — R197 Diarrhea, unspecified: Secondary | ICD-10-CM | POA: Diagnosis not present

## 2016-02-09 DIAGNOSIS — Z09 Encounter for follow-up examination after completed treatment for conditions other than malignant neoplasm: Secondary | ICD-10-CM | POA: Diagnosis not present

## 2016-02-09 DIAGNOSIS — Z87440 Personal history of urinary (tract) infections: Secondary | ICD-10-CM | POA: Diagnosis not present

## 2016-02-09 DIAGNOSIS — D72829 Elevated white blood cell count, unspecified: Secondary | ICD-10-CM | POA: Diagnosis not present

## 2016-02-10 DIAGNOSIS — R197 Diarrhea, unspecified: Secondary | ICD-10-CM | POA: Diagnosis not present

## 2016-02-13 DIAGNOSIS — R278 Other lack of coordination: Secondary | ICD-10-CM | POA: Diagnosis not present

## 2016-02-13 DIAGNOSIS — N3946 Mixed incontinence: Secondary | ICD-10-CM | POA: Diagnosis not present

## 2016-02-13 DIAGNOSIS — R260 Ataxic gait: Secondary | ICD-10-CM | POA: Diagnosis not present

## 2016-02-13 DIAGNOSIS — M6281 Muscle weakness (generalized): Secondary | ICD-10-CM | POA: Diagnosis not present

## 2016-02-13 DIAGNOSIS — R262 Difficulty in walking, not elsewhere classified: Secondary | ICD-10-CM | POA: Diagnosis not present

## 2016-02-13 DIAGNOSIS — Z9181 History of falling: Secondary | ICD-10-CM | POA: Diagnosis not present

## 2016-02-14 DIAGNOSIS — R278 Other lack of coordination: Secondary | ICD-10-CM | POA: Diagnosis not present

## 2016-02-14 DIAGNOSIS — Z9181 History of falling: Secondary | ICD-10-CM | POA: Diagnosis not present

## 2016-02-14 DIAGNOSIS — N3946 Mixed incontinence: Secondary | ICD-10-CM | POA: Diagnosis not present

## 2016-02-14 DIAGNOSIS — R262 Difficulty in walking, not elsewhere classified: Secondary | ICD-10-CM | POA: Diagnosis not present

## 2016-02-14 DIAGNOSIS — R260 Ataxic gait: Secondary | ICD-10-CM | POA: Diagnosis not present

## 2016-02-14 DIAGNOSIS — M6281 Muscle weakness (generalized): Secondary | ICD-10-CM | POA: Diagnosis not present

## 2016-02-16 DIAGNOSIS — Z9181 History of falling: Secondary | ICD-10-CM | POA: Diagnosis not present

## 2016-02-16 DIAGNOSIS — M6281 Muscle weakness (generalized): Secondary | ICD-10-CM | POA: Diagnosis not present

## 2016-02-16 DIAGNOSIS — R278 Other lack of coordination: Secondary | ICD-10-CM | POA: Diagnosis not present

## 2016-02-16 DIAGNOSIS — N3946 Mixed incontinence: Secondary | ICD-10-CM | POA: Diagnosis not present

## 2016-02-16 DIAGNOSIS — R262 Difficulty in walking, not elsewhere classified: Secondary | ICD-10-CM | POA: Diagnosis not present

## 2016-02-16 DIAGNOSIS — R260 Ataxic gait: Secondary | ICD-10-CM | POA: Diagnosis not present

## 2016-02-20 DIAGNOSIS — Z9181 History of falling: Secondary | ICD-10-CM | POA: Diagnosis not present

## 2016-02-20 DIAGNOSIS — M6281 Muscle weakness (generalized): Secondary | ICD-10-CM | POA: Diagnosis not present

## 2016-02-20 DIAGNOSIS — R260 Ataxic gait: Secondary | ICD-10-CM | POA: Diagnosis not present

## 2016-02-20 DIAGNOSIS — N3946 Mixed incontinence: Secondary | ICD-10-CM | POA: Diagnosis not present

## 2016-02-20 DIAGNOSIS — R278 Other lack of coordination: Secondary | ICD-10-CM | POA: Diagnosis not present

## 2016-02-20 DIAGNOSIS — R262 Difficulty in walking, not elsewhere classified: Secondary | ICD-10-CM | POA: Diagnosis not present

## 2016-02-21 DIAGNOSIS — N3946 Mixed incontinence: Secondary | ICD-10-CM | POA: Diagnosis not present

## 2016-02-21 DIAGNOSIS — R262 Difficulty in walking, not elsewhere classified: Secondary | ICD-10-CM | POA: Diagnosis not present

## 2016-02-21 DIAGNOSIS — R260 Ataxic gait: Secondary | ICD-10-CM | POA: Diagnosis not present

## 2016-02-21 DIAGNOSIS — M6281 Muscle weakness (generalized): Secondary | ICD-10-CM | POA: Diagnosis not present

## 2016-02-21 DIAGNOSIS — R278 Other lack of coordination: Secondary | ICD-10-CM | POA: Diagnosis not present

## 2016-02-21 DIAGNOSIS — Z9181 History of falling: Secondary | ICD-10-CM | POA: Diagnosis not present

## 2016-02-23 DIAGNOSIS — R278 Other lack of coordination: Secondary | ICD-10-CM | POA: Diagnosis not present

## 2016-02-23 DIAGNOSIS — R262 Difficulty in walking, not elsewhere classified: Secondary | ICD-10-CM | POA: Diagnosis not present

## 2016-02-23 DIAGNOSIS — Z9181 History of falling: Secondary | ICD-10-CM | POA: Diagnosis not present

## 2016-02-23 DIAGNOSIS — M6281 Muscle weakness (generalized): Secondary | ICD-10-CM | POA: Diagnosis not present

## 2016-02-23 DIAGNOSIS — N3946 Mixed incontinence: Secondary | ICD-10-CM | POA: Diagnosis not present

## 2016-02-23 DIAGNOSIS — R260 Ataxic gait: Secondary | ICD-10-CM | POA: Diagnosis not present

## 2016-02-27 DIAGNOSIS — N3946 Mixed incontinence: Secondary | ICD-10-CM | POA: Diagnosis not present

## 2016-02-27 DIAGNOSIS — M6281 Muscle weakness (generalized): Secondary | ICD-10-CM | POA: Diagnosis not present

## 2016-02-27 DIAGNOSIS — Z87440 Personal history of urinary (tract) infections: Secondary | ICD-10-CM | POA: Diagnosis not present

## 2016-02-27 DIAGNOSIS — R278 Other lack of coordination: Secondary | ICD-10-CM | POA: Diagnosis not present

## 2016-02-27 DIAGNOSIS — Z9181 History of falling: Secondary | ICD-10-CM | POA: Diagnosis not present

## 2016-02-27 DIAGNOSIS — R262 Difficulty in walking, not elsewhere classified: Secondary | ICD-10-CM | POA: Diagnosis not present

## 2016-02-27 DIAGNOSIS — R260 Ataxic gait: Secondary | ICD-10-CM | POA: Diagnosis not present

## 2016-02-28 DIAGNOSIS — R278 Other lack of coordination: Secondary | ICD-10-CM | POA: Diagnosis not present

## 2016-02-28 DIAGNOSIS — Z9181 History of falling: Secondary | ICD-10-CM | POA: Diagnosis not present

## 2016-02-28 DIAGNOSIS — N3946 Mixed incontinence: Secondary | ICD-10-CM | POA: Diagnosis not present

## 2016-02-28 DIAGNOSIS — R260 Ataxic gait: Secondary | ICD-10-CM | POA: Diagnosis not present

## 2016-02-28 DIAGNOSIS — R262 Difficulty in walking, not elsewhere classified: Secondary | ICD-10-CM | POA: Diagnosis not present

## 2016-02-28 DIAGNOSIS — M6281 Muscle weakness (generalized): Secondary | ICD-10-CM | POA: Diagnosis not present

## 2016-03-01 DIAGNOSIS — N3946 Mixed incontinence: Secondary | ICD-10-CM | POA: Diagnosis not present

## 2016-03-01 DIAGNOSIS — R262 Difficulty in walking, not elsewhere classified: Secondary | ICD-10-CM | POA: Diagnosis not present

## 2016-03-01 DIAGNOSIS — R278 Other lack of coordination: Secondary | ICD-10-CM | POA: Diagnosis not present

## 2016-03-01 DIAGNOSIS — Z9181 History of falling: Secondary | ICD-10-CM | POA: Diagnosis not present

## 2016-03-01 DIAGNOSIS — R260 Ataxic gait: Secondary | ICD-10-CM | POA: Diagnosis not present

## 2016-03-01 DIAGNOSIS — M6281 Muscle weakness (generalized): Secondary | ICD-10-CM | POA: Diagnosis not present

## 2016-03-05 DIAGNOSIS — R262 Difficulty in walking, not elsewhere classified: Secondary | ICD-10-CM | POA: Diagnosis not present

## 2016-03-05 DIAGNOSIS — N3946 Mixed incontinence: Secondary | ICD-10-CM | POA: Diagnosis not present

## 2016-03-05 DIAGNOSIS — M6281 Muscle weakness (generalized): Secondary | ICD-10-CM | POA: Diagnosis not present

## 2016-03-05 DIAGNOSIS — Z9181 History of falling: Secondary | ICD-10-CM | POA: Diagnosis not present

## 2016-03-05 DIAGNOSIS — R260 Ataxic gait: Secondary | ICD-10-CM | POA: Diagnosis not present

## 2016-03-05 DIAGNOSIS — R278 Other lack of coordination: Secondary | ICD-10-CM | POA: Diagnosis not present

## 2016-03-06 DIAGNOSIS — N3946 Mixed incontinence: Secondary | ICD-10-CM | POA: Diagnosis not present

## 2016-03-06 DIAGNOSIS — M6281 Muscle weakness (generalized): Secondary | ICD-10-CM | POA: Diagnosis not present

## 2016-03-06 DIAGNOSIS — R278 Other lack of coordination: Secondary | ICD-10-CM | POA: Diagnosis not present

## 2016-03-06 DIAGNOSIS — Z9181 History of falling: Secondary | ICD-10-CM | POA: Diagnosis not present

## 2016-03-06 DIAGNOSIS — R260 Ataxic gait: Secondary | ICD-10-CM | POA: Diagnosis not present

## 2016-03-06 DIAGNOSIS — R262 Difficulty in walking, not elsewhere classified: Secondary | ICD-10-CM | POA: Diagnosis not present

## 2016-03-08 DIAGNOSIS — Z9181 History of falling: Secondary | ICD-10-CM | POA: Diagnosis not present

## 2016-03-08 DIAGNOSIS — R278 Other lack of coordination: Secondary | ICD-10-CM | POA: Diagnosis not present

## 2016-03-08 DIAGNOSIS — R260 Ataxic gait: Secondary | ICD-10-CM | POA: Diagnosis not present

## 2016-03-08 DIAGNOSIS — N3946 Mixed incontinence: Secondary | ICD-10-CM | POA: Diagnosis not present

## 2016-03-08 DIAGNOSIS — R262 Difficulty in walking, not elsewhere classified: Secondary | ICD-10-CM | POA: Diagnosis not present

## 2016-03-08 DIAGNOSIS — M6281 Muscle weakness (generalized): Secondary | ICD-10-CM | POA: Diagnosis not present

## 2016-03-12 DIAGNOSIS — Z9181 History of falling: Secondary | ICD-10-CM | POA: Diagnosis not present

## 2016-03-12 DIAGNOSIS — R278 Other lack of coordination: Secondary | ICD-10-CM | POA: Diagnosis not present

## 2016-03-12 DIAGNOSIS — R260 Ataxic gait: Secondary | ICD-10-CM | POA: Diagnosis not present

## 2016-03-12 DIAGNOSIS — M6281 Muscle weakness (generalized): Secondary | ICD-10-CM | POA: Diagnosis not present

## 2016-03-12 DIAGNOSIS — R262 Difficulty in walking, not elsewhere classified: Secondary | ICD-10-CM | POA: Diagnosis not present

## 2016-03-12 DIAGNOSIS — N3946 Mixed incontinence: Secondary | ICD-10-CM | POA: Diagnosis not present

## 2016-03-13 DIAGNOSIS — R278 Other lack of coordination: Secondary | ICD-10-CM | POA: Diagnosis not present

## 2016-03-13 DIAGNOSIS — N3946 Mixed incontinence: Secondary | ICD-10-CM | POA: Diagnosis not present

## 2016-03-13 DIAGNOSIS — Z9181 History of falling: Secondary | ICD-10-CM | POA: Diagnosis not present

## 2016-03-13 DIAGNOSIS — M6281 Muscle weakness (generalized): Secondary | ICD-10-CM | POA: Diagnosis not present

## 2016-03-13 DIAGNOSIS — R262 Difficulty in walking, not elsewhere classified: Secondary | ICD-10-CM | POA: Diagnosis not present

## 2016-03-13 DIAGNOSIS — R260 Ataxic gait: Secondary | ICD-10-CM | POA: Diagnosis not present

## 2016-03-15 DIAGNOSIS — Z9181 History of falling: Secondary | ICD-10-CM | POA: Diagnosis not present

## 2016-03-15 DIAGNOSIS — N3946 Mixed incontinence: Secondary | ICD-10-CM | POA: Diagnosis not present

## 2016-03-15 DIAGNOSIS — R278 Other lack of coordination: Secondary | ICD-10-CM | POA: Diagnosis not present

## 2016-03-15 DIAGNOSIS — R260 Ataxic gait: Secondary | ICD-10-CM | POA: Diagnosis not present

## 2016-03-15 DIAGNOSIS — R262 Difficulty in walking, not elsewhere classified: Secondary | ICD-10-CM | POA: Diagnosis not present

## 2016-03-15 DIAGNOSIS — M6281 Muscle weakness (generalized): Secondary | ICD-10-CM | POA: Diagnosis not present

## 2016-03-19 DIAGNOSIS — R260 Ataxic gait: Secondary | ICD-10-CM | POA: Diagnosis not present

## 2016-03-19 DIAGNOSIS — Z9181 History of falling: Secondary | ICD-10-CM | POA: Diagnosis not present

## 2016-03-19 DIAGNOSIS — R278 Other lack of coordination: Secondary | ICD-10-CM | POA: Diagnosis not present

## 2016-03-19 DIAGNOSIS — M6281 Muscle weakness (generalized): Secondary | ICD-10-CM | POA: Diagnosis not present

## 2016-03-19 DIAGNOSIS — N3946 Mixed incontinence: Secondary | ICD-10-CM | POA: Diagnosis not present

## 2016-03-19 DIAGNOSIS — R262 Difficulty in walking, not elsewhere classified: Secondary | ICD-10-CM | POA: Diagnosis not present

## 2016-03-20 DIAGNOSIS — N3946 Mixed incontinence: Secondary | ICD-10-CM | POA: Diagnosis not present

## 2016-03-20 DIAGNOSIS — M6281 Muscle weakness (generalized): Secondary | ICD-10-CM | POA: Diagnosis not present

## 2016-03-20 DIAGNOSIS — R260 Ataxic gait: Secondary | ICD-10-CM | POA: Diagnosis not present

## 2016-03-20 DIAGNOSIS — Z9181 History of falling: Secondary | ICD-10-CM | POA: Diagnosis not present

## 2016-03-20 DIAGNOSIS — R262 Difficulty in walking, not elsewhere classified: Secondary | ICD-10-CM | POA: Diagnosis not present

## 2016-03-20 DIAGNOSIS — R278 Other lack of coordination: Secondary | ICD-10-CM | POA: Diagnosis not present

## 2016-03-26 DIAGNOSIS — R278 Other lack of coordination: Secondary | ICD-10-CM | POA: Diagnosis not present

## 2016-03-26 DIAGNOSIS — N3946 Mixed incontinence: Secondary | ICD-10-CM | POA: Diagnosis not present

## 2016-03-26 DIAGNOSIS — R260 Ataxic gait: Secondary | ICD-10-CM | POA: Diagnosis not present

## 2016-03-26 DIAGNOSIS — Z9181 History of falling: Secondary | ICD-10-CM | POA: Diagnosis not present

## 2016-03-26 DIAGNOSIS — M6281 Muscle weakness (generalized): Secondary | ICD-10-CM | POA: Diagnosis not present

## 2016-03-26 DIAGNOSIS — R262 Difficulty in walking, not elsewhere classified: Secondary | ICD-10-CM | POA: Diagnosis not present

## 2016-03-27 DIAGNOSIS — R260 Ataxic gait: Secondary | ICD-10-CM | POA: Diagnosis not present

## 2016-03-27 DIAGNOSIS — R278 Other lack of coordination: Secondary | ICD-10-CM | POA: Diagnosis not present

## 2016-03-27 DIAGNOSIS — N3946 Mixed incontinence: Secondary | ICD-10-CM | POA: Diagnosis not present

## 2016-03-27 DIAGNOSIS — M6281 Muscle weakness (generalized): Secondary | ICD-10-CM | POA: Diagnosis not present

## 2016-03-27 DIAGNOSIS — R262 Difficulty in walking, not elsewhere classified: Secondary | ICD-10-CM | POA: Diagnosis not present

## 2016-03-27 DIAGNOSIS — Z9181 History of falling: Secondary | ICD-10-CM | POA: Diagnosis not present

## 2016-03-29 DIAGNOSIS — R262 Difficulty in walking, not elsewhere classified: Secondary | ICD-10-CM | POA: Diagnosis not present

## 2016-03-29 DIAGNOSIS — R278 Other lack of coordination: Secondary | ICD-10-CM | POA: Diagnosis not present

## 2016-03-29 DIAGNOSIS — R260 Ataxic gait: Secondary | ICD-10-CM | POA: Diagnosis not present

## 2016-03-29 DIAGNOSIS — Z9181 History of falling: Secondary | ICD-10-CM | POA: Diagnosis not present

## 2016-03-29 DIAGNOSIS — M6281 Muscle weakness (generalized): Secondary | ICD-10-CM | POA: Diagnosis not present

## 2016-03-29 DIAGNOSIS — N3946 Mixed incontinence: Secondary | ICD-10-CM | POA: Diagnosis not present

## 2016-03-30 DIAGNOSIS — R278 Other lack of coordination: Secondary | ICD-10-CM | POA: Diagnosis not present

## 2016-03-30 DIAGNOSIS — R262 Difficulty in walking, not elsewhere classified: Secondary | ICD-10-CM | POA: Diagnosis not present

## 2016-03-30 DIAGNOSIS — M6281 Muscle weakness (generalized): Secondary | ICD-10-CM | POA: Diagnosis not present

## 2016-03-30 DIAGNOSIS — R260 Ataxic gait: Secondary | ICD-10-CM | POA: Diagnosis not present

## 2016-03-30 DIAGNOSIS — Z9181 History of falling: Secondary | ICD-10-CM | POA: Diagnosis not present

## 2016-03-30 DIAGNOSIS — N3946 Mixed incontinence: Secondary | ICD-10-CM | POA: Diagnosis not present

## 2016-04-02 DIAGNOSIS — N3942 Incontinence without sensory awareness: Secondary | ICD-10-CM | POA: Diagnosis not present

## 2016-04-02 DIAGNOSIS — M6281 Muscle weakness (generalized): Secondary | ICD-10-CM | POA: Diagnosis not present

## 2016-04-02 DIAGNOSIS — Z9181 History of falling: Secondary | ICD-10-CM | POA: Diagnosis not present

## 2016-04-02 DIAGNOSIS — R262 Difficulty in walking, not elsewhere classified: Secondary | ICD-10-CM | POA: Diagnosis not present

## 2016-04-02 DIAGNOSIS — N3946 Mixed incontinence: Secondary | ICD-10-CM | POA: Diagnosis not present

## 2016-04-02 DIAGNOSIS — R278 Other lack of coordination: Secondary | ICD-10-CM | POA: Diagnosis not present

## 2016-04-02 DIAGNOSIS — R260 Ataxic gait: Secondary | ICD-10-CM | POA: Diagnosis not present

## 2016-04-02 DIAGNOSIS — N390 Urinary tract infection, site not specified: Secondary | ICD-10-CM | POA: Diagnosis not present

## 2016-04-02 DIAGNOSIS — R3915 Urgency of urination: Secondary | ICD-10-CM | POA: Diagnosis not present

## 2016-04-03 DIAGNOSIS — M6281 Muscle weakness (generalized): Secondary | ICD-10-CM | POA: Diagnosis not present

## 2016-04-03 DIAGNOSIS — Z9181 History of falling: Secondary | ICD-10-CM | POA: Diagnosis not present

## 2016-04-03 DIAGNOSIS — R278 Other lack of coordination: Secondary | ICD-10-CM | POA: Diagnosis not present

## 2016-04-03 DIAGNOSIS — R262 Difficulty in walking, not elsewhere classified: Secondary | ICD-10-CM | POA: Diagnosis not present

## 2016-04-03 DIAGNOSIS — R260 Ataxic gait: Secondary | ICD-10-CM | POA: Diagnosis not present

## 2016-04-03 DIAGNOSIS — N3946 Mixed incontinence: Secondary | ICD-10-CM | POA: Diagnosis not present

## 2016-04-05 DIAGNOSIS — R278 Other lack of coordination: Secondary | ICD-10-CM | POA: Diagnosis not present

## 2016-04-05 DIAGNOSIS — M6281 Muscle weakness (generalized): Secondary | ICD-10-CM | POA: Diagnosis not present

## 2016-04-05 DIAGNOSIS — N3946 Mixed incontinence: Secondary | ICD-10-CM | POA: Diagnosis not present

## 2016-04-05 DIAGNOSIS — R260 Ataxic gait: Secondary | ICD-10-CM | POA: Diagnosis not present

## 2016-04-05 DIAGNOSIS — Z9181 History of falling: Secondary | ICD-10-CM | POA: Diagnosis not present

## 2016-04-05 DIAGNOSIS — R262 Difficulty in walking, not elsewhere classified: Secondary | ICD-10-CM | POA: Diagnosis not present

## 2016-04-06 DIAGNOSIS — R262 Difficulty in walking, not elsewhere classified: Secondary | ICD-10-CM | POA: Diagnosis not present

## 2016-04-06 DIAGNOSIS — R278 Other lack of coordination: Secondary | ICD-10-CM | POA: Diagnosis not present

## 2016-04-06 DIAGNOSIS — M6281 Muscle weakness (generalized): Secondary | ICD-10-CM | POA: Diagnosis not present

## 2016-04-06 DIAGNOSIS — R260 Ataxic gait: Secondary | ICD-10-CM | POA: Diagnosis not present

## 2016-04-06 DIAGNOSIS — Z9181 History of falling: Secondary | ICD-10-CM | POA: Diagnosis not present

## 2016-04-06 DIAGNOSIS — N3946 Mixed incontinence: Secondary | ICD-10-CM | POA: Diagnosis not present

## 2016-04-09 DIAGNOSIS — N3946 Mixed incontinence: Secondary | ICD-10-CM | POA: Diagnosis not present

## 2016-04-09 DIAGNOSIS — R260 Ataxic gait: Secondary | ICD-10-CM | POA: Diagnosis not present

## 2016-04-09 DIAGNOSIS — Z9181 History of falling: Secondary | ICD-10-CM | POA: Diagnosis not present

## 2016-04-09 DIAGNOSIS — R262 Difficulty in walking, not elsewhere classified: Secondary | ICD-10-CM | POA: Diagnosis not present

## 2016-04-09 DIAGNOSIS — R278 Other lack of coordination: Secondary | ICD-10-CM | POA: Diagnosis not present

## 2016-04-09 DIAGNOSIS — M6281 Muscle weakness (generalized): Secondary | ICD-10-CM | POA: Diagnosis not present

## 2016-04-10 DIAGNOSIS — Z9181 History of falling: Secondary | ICD-10-CM | POA: Diagnosis not present

## 2016-04-10 DIAGNOSIS — R260 Ataxic gait: Secondary | ICD-10-CM | POA: Diagnosis not present

## 2016-04-10 DIAGNOSIS — R262 Difficulty in walking, not elsewhere classified: Secondary | ICD-10-CM | POA: Diagnosis not present

## 2016-04-10 DIAGNOSIS — R278 Other lack of coordination: Secondary | ICD-10-CM | POA: Diagnosis not present

## 2016-04-10 DIAGNOSIS — N3946 Mixed incontinence: Secondary | ICD-10-CM | POA: Diagnosis not present

## 2016-04-10 DIAGNOSIS — M6281 Muscle weakness (generalized): Secondary | ICD-10-CM | POA: Diagnosis not present

## 2016-04-11 DIAGNOSIS — F418 Other specified anxiety disorders: Secondary | ICD-10-CM | POA: Diagnosis not present

## 2016-04-12 DIAGNOSIS — N3946 Mixed incontinence: Secondary | ICD-10-CM | POA: Diagnosis not present

## 2016-04-12 DIAGNOSIS — R262 Difficulty in walking, not elsewhere classified: Secondary | ICD-10-CM | POA: Diagnosis not present

## 2016-04-12 DIAGNOSIS — R278 Other lack of coordination: Secondary | ICD-10-CM | POA: Diagnosis not present

## 2016-04-12 DIAGNOSIS — R260 Ataxic gait: Secondary | ICD-10-CM | POA: Diagnosis not present

## 2016-04-12 DIAGNOSIS — M6281 Muscle weakness (generalized): Secondary | ICD-10-CM | POA: Diagnosis not present

## 2016-04-12 DIAGNOSIS — Z9181 History of falling: Secondary | ICD-10-CM | POA: Diagnosis not present

## 2016-04-16 DIAGNOSIS — M6281 Muscle weakness (generalized): Secondary | ICD-10-CM | POA: Diagnosis not present

## 2016-04-16 DIAGNOSIS — R260 Ataxic gait: Secondary | ICD-10-CM | POA: Diagnosis not present

## 2016-04-16 DIAGNOSIS — Z9181 History of falling: Secondary | ICD-10-CM | POA: Diagnosis not present

## 2016-04-16 DIAGNOSIS — R262 Difficulty in walking, not elsewhere classified: Secondary | ICD-10-CM | POA: Diagnosis not present

## 2016-04-16 DIAGNOSIS — N3946 Mixed incontinence: Secondary | ICD-10-CM | POA: Diagnosis not present

## 2016-04-16 DIAGNOSIS — R278 Other lack of coordination: Secondary | ICD-10-CM | POA: Diagnosis not present

## 2016-04-17 DIAGNOSIS — N3946 Mixed incontinence: Secondary | ICD-10-CM | POA: Diagnosis not present

## 2016-04-17 DIAGNOSIS — R260 Ataxic gait: Secondary | ICD-10-CM | POA: Diagnosis not present

## 2016-04-17 DIAGNOSIS — M6281 Muscle weakness (generalized): Secondary | ICD-10-CM | POA: Diagnosis not present

## 2016-04-17 DIAGNOSIS — R262 Difficulty in walking, not elsewhere classified: Secondary | ICD-10-CM | POA: Diagnosis not present

## 2016-04-17 DIAGNOSIS — R278 Other lack of coordination: Secondary | ICD-10-CM | POA: Diagnosis not present

## 2016-04-17 DIAGNOSIS — Z9181 History of falling: Secondary | ICD-10-CM | POA: Diagnosis not present

## 2016-04-19 DIAGNOSIS — N3946 Mixed incontinence: Secondary | ICD-10-CM | POA: Diagnosis not present

## 2016-04-19 DIAGNOSIS — R262 Difficulty in walking, not elsewhere classified: Secondary | ICD-10-CM | POA: Diagnosis not present

## 2016-04-19 DIAGNOSIS — M6281 Muscle weakness (generalized): Secondary | ICD-10-CM | POA: Diagnosis not present

## 2016-04-19 DIAGNOSIS — R278 Other lack of coordination: Secondary | ICD-10-CM | POA: Diagnosis not present

## 2016-04-19 DIAGNOSIS — R260 Ataxic gait: Secondary | ICD-10-CM | POA: Diagnosis not present

## 2016-04-19 DIAGNOSIS — Z9181 History of falling: Secondary | ICD-10-CM | POA: Diagnosis not present

## 2016-04-24 DIAGNOSIS — Z9181 History of falling: Secondary | ICD-10-CM | POA: Diagnosis not present

## 2016-04-24 DIAGNOSIS — N3946 Mixed incontinence: Secondary | ICD-10-CM | POA: Diagnosis not present

## 2016-04-24 DIAGNOSIS — R278 Other lack of coordination: Secondary | ICD-10-CM | POA: Diagnosis not present

## 2016-04-24 DIAGNOSIS — M6281 Muscle weakness (generalized): Secondary | ICD-10-CM | POA: Diagnosis not present

## 2016-04-24 DIAGNOSIS — R260 Ataxic gait: Secondary | ICD-10-CM | POA: Diagnosis not present

## 2016-04-24 DIAGNOSIS — R262 Difficulty in walking, not elsewhere classified: Secondary | ICD-10-CM | POA: Diagnosis not present

## 2016-04-25 DIAGNOSIS — R262 Difficulty in walking, not elsewhere classified: Secondary | ICD-10-CM | POA: Diagnosis not present

## 2016-04-25 DIAGNOSIS — M6281 Muscle weakness (generalized): Secondary | ICD-10-CM | POA: Diagnosis not present

## 2016-04-25 DIAGNOSIS — R278 Other lack of coordination: Secondary | ICD-10-CM | POA: Diagnosis not present

## 2016-04-25 DIAGNOSIS — N3946 Mixed incontinence: Secondary | ICD-10-CM | POA: Diagnosis not present

## 2016-04-25 DIAGNOSIS — Z9181 History of falling: Secondary | ICD-10-CM | POA: Diagnosis not present

## 2016-04-25 DIAGNOSIS — R260 Ataxic gait: Secondary | ICD-10-CM | POA: Diagnosis not present

## 2016-05-01 DIAGNOSIS — R260 Ataxic gait: Secondary | ICD-10-CM | POA: Diagnosis not present

## 2016-05-01 DIAGNOSIS — M6281 Muscle weakness (generalized): Secondary | ICD-10-CM | POA: Diagnosis not present

## 2016-05-01 DIAGNOSIS — N3946 Mixed incontinence: Secondary | ICD-10-CM | POA: Diagnosis not present

## 2016-05-01 DIAGNOSIS — R262 Difficulty in walking, not elsewhere classified: Secondary | ICD-10-CM | POA: Diagnosis not present

## 2016-05-01 DIAGNOSIS — R278 Other lack of coordination: Secondary | ICD-10-CM | POA: Diagnosis not present

## 2016-05-01 DIAGNOSIS — Z9181 History of falling: Secondary | ICD-10-CM | POA: Diagnosis not present

## 2016-05-02 DIAGNOSIS — R260 Ataxic gait: Secondary | ICD-10-CM | POA: Diagnosis not present

## 2016-05-02 DIAGNOSIS — N3946 Mixed incontinence: Secondary | ICD-10-CM | POA: Diagnosis not present

## 2016-05-02 DIAGNOSIS — M6281 Muscle weakness (generalized): Secondary | ICD-10-CM | POA: Diagnosis not present

## 2016-05-02 DIAGNOSIS — Z9181 History of falling: Secondary | ICD-10-CM | POA: Diagnosis not present

## 2016-05-02 DIAGNOSIS — R262 Difficulty in walking, not elsewhere classified: Secondary | ICD-10-CM | POA: Diagnosis not present

## 2016-05-02 DIAGNOSIS — R278 Other lack of coordination: Secondary | ICD-10-CM | POA: Diagnosis not present

## 2016-05-03 DIAGNOSIS — R278 Other lack of coordination: Secondary | ICD-10-CM | POA: Diagnosis not present

## 2016-05-03 DIAGNOSIS — M6281 Muscle weakness (generalized): Secondary | ICD-10-CM | POA: Diagnosis not present

## 2016-05-03 DIAGNOSIS — R260 Ataxic gait: Secondary | ICD-10-CM | POA: Diagnosis not present

## 2016-05-03 DIAGNOSIS — R262 Difficulty in walking, not elsewhere classified: Secondary | ICD-10-CM | POA: Diagnosis not present

## 2016-05-03 DIAGNOSIS — N3946 Mixed incontinence: Secondary | ICD-10-CM | POA: Diagnosis not present

## 2016-05-03 DIAGNOSIS — Z9181 History of falling: Secondary | ICD-10-CM | POA: Diagnosis not present

## 2016-05-07 DIAGNOSIS — N3946 Mixed incontinence: Secondary | ICD-10-CM | POA: Diagnosis not present

## 2016-05-07 DIAGNOSIS — M6281 Muscle weakness (generalized): Secondary | ICD-10-CM | POA: Diagnosis not present

## 2016-05-07 DIAGNOSIS — R278 Other lack of coordination: Secondary | ICD-10-CM | POA: Diagnosis not present

## 2016-05-07 DIAGNOSIS — R260 Ataxic gait: Secondary | ICD-10-CM | POA: Diagnosis not present

## 2016-05-07 DIAGNOSIS — R262 Difficulty in walking, not elsewhere classified: Secondary | ICD-10-CM | POA: Diagnosis not present

## 2016-05-07 DIAGNOSIS — Z9181 History of falling: Secondary | ICD-10-CM | POA: Diagnosis not present

## 2016-05-08 DIAGNOSIS — Z9181 History of falling: Secondary | ICD-10-CM | POA: Diagnosis not present

## 2016-05-08 DIAGNOSIS — R262 Difficulty in walking, not elsewhere classified: Secondary | ICD-10-CM | POA: Diagnosis not present

## 2016-05-08 DIAGNOSIS — M6281 Muscle weakness (generalized): Secondary | ICD-10-CM | POA: Diagnosis not present

## 2016-05-08 DIAGNOSIS — R278 Other lack of coordination: Secondary | ICD-10-CM | POA: Diagnosis not present

## 2016-05-08 DIAGNOSIS — N3946 Mixed incontinence: Secondary | ICD-10-CM | POA: Diagnosis not present

## 2016-05-08 DIAGNOSIS — R260 Ataxic gait: Secondary | ICD-10-CM | POA: Diagnosis not present

## 2016-05-10 DIAGNOSIS — R262 Difficulty in walking, not elsewhere classified: Secondary | ICD-10-CM | POA: Diagnosis not present

## 2016-05-10 DIAGNOSIS — Z9181 History of falling: Secondary | ICD-10-CM | POA: Diagnosis not present

## 2016-05-10 DIAGNOSIS — M6281 Muscle weakness (generalized): Secondary | ICD-10-CM | POA: Diagnosis not present

## 2016-05-10 DIAGNOSIS — R260 Ataxic gait: Secondary | ICD-10-CM | POA: Diagnosis not present

## 2016-05-10 DIAGNOSIS — R278 Other lack of coordination: Secondary | ICD-10-CM | POA: Diagnosis not present

## 2016-05-10 DIAGNOSIS — N3946 Mixed incontinence: Secondary | ICD-10-CM | POA: Diagnosis not present

## 2016-05-15 DIAGNOSIS — M6281 Muscle weakness (generalized): Secondary | ICD-10-CM | POA: Diagnosis not present

## 2016-05-15 DIAGNOSIS — Z9181 History of falling: Secondary | ICD-10-CM | POA: Diagnosis not present

## 2016-05-15 DIAGNOSIS — N3946 Mixed incontinence: Secondary | ICD-10-CM | POA: Diagnosis not present

## 2016-05-15 DIAGNOSIS — R278 Other lack of coordination: Secondary | ICD-10-CM | POA: Diagnosis not present

## 2016-05-15 DIAGNOSIS — R262 Difficulty in walking, not elsewhere classified: Secondary | ICD-10-CM | POA: Diagnosis not present

## 2016-05-15 DIAGNOSIS — R260 Ataxic gait: Secondary | ICD-10-CM | POA: Diagnosis not present

## 2016-05-21 DIAGNOSIS — N3946 Mixed incontinence: Secondary | ICD-10-CM | POA: Diagnosis not present

## 2016-05-21 DIAGNOSIS — Z9181 History of falling: Secondary | ICD-10-CM | POA: Diagnosis not present

## 2016-05-21 DIAGNOSIS — R278 Other lack of coordination: Secondary | ICD-10-CM | POA: Diagnosis not present

## 2016-05-21 DIAGNOSIS — M6281 Muscle weakness (generalized): Secondary | ICD-10-CM | POA: Diagnosis not present

## 2016-05-21 DIAGNOSIS — R262 Difficulty in walking, not elsewhere classified: Secondary | ICD-10-CM | POA: Diagnosis not present

## 2016-05-21 DIAGNOSIS — R260 Ataxic gait: Secondary | ICD-10-CM | POA: Diagnosis not present

## 2016-05-22 DIAGNOSIS — M6281 Muscle weakness (generalized): Secondary | ICD-10-CM | POA: Diagnosis not present

## 2016-05-22 DIAGNOSIS — R262 Difficulty in walking, not elsewhere classified: Secondary | ICD-10-CM | POA: Diagnosis not present

## 2016-05-22 DIAGNOSIS — N3946 Mixed incontinence: Secondary | ICD-10-CM | POA: Diagnosis not present

## 2016-05-22 DIAGNOSIS — R278 Other lack of coordination: Secondary | ICD-10-CM | POA: Diagnosis not present

## 2016-05-22 DIAGNOSIS — R260 Ataxic gait: Secondary | ICD-10-CM | POA: Diagnosis not present

## 2016-05-22 DIAGNOSIS — Z9181 History of falling: Secondary | ICD-10-CM | POA: Diagnosis not present

## 2016-05-24 DIAGNOSIS — Z9181 History of falling: Secondary | ICD-10-CM | POA: Diagnosis not present

## 2016-05-24 DIAGNOSIS — R278 Other lack of coordination: Secondary | ICD-10-CM | POA: Diagnosis not present

## 2016-05-24 DIAGNOSIS — M6281 Muscle weakness (generalized): Secondary | ICD-10-CM | POA: Diagnosis not present

## 2016-05-24 DIAGNOSIS — N3946 Mixed incontinence: Secondary | ICD-10-CM | POA: Diagnosis not present

## 2016-05-24 DIAGNOSIS — R262 Difficulty in walking, not elsewhere classified: Secondary | ICD-10-CM | POA: Diagnosis not present

## 2016-05-24 DIAGNOSIS — R260 Ataxic gait: Secondary | ICD-10-CM | POA: Diagnosis not present

## 2016-05-25 DIAGNOSIS — N3946 Mixed incontinence: Secondary | ICD-10-CM | POA: Diagnosis not present

## 2016-05-25 DIAGNOSIS — R278 Other lack of coordination: Secondary | ICD-10-CM | POA: Diagnosis not present

## 2016-05-25 DIAGNOSIS — R262 Difficulty in walking, not elsewhere classified: Secondary | ICD-10-CM | POA: Diagnosis not present

## 2016-05-25 DIAGNOSIS — R260 Ataxic gait: Secondary | ICD-10-CM | POA: Diagnosis not present

## 2016-05-25 DIAGNOSIS — Z9181 History of falling: Secondary | ICD-10-CM | POA: Diagnosis not present

## 2016-05-25 DIAGNOSIS — M6281 Muscle weakness (generalized): Secondary | ICD-10-CM | POA: Diagnosis not present

## 2016-05-28 DIAGNOSIS — Z9181 History of falling: Secondary | ICD-10-CM | POA: Diagnosis not present

## 2016-05-28 DIAGNOSIS — R260 Ataxic gait: Secondary | ICD-10-CM | POA: Diagnosis not present

## 2016-05-28 DIAGNOSIS — R262 Difficulty in walking, not elsewhere classified: Secondary | ICD-10-CM | POA: Diagnosis not present

## 2016-05-28 DIAGNOSIS — M6281 Muscle weakness (generalized): Secondary | ICD-10-CM | POA: Diagnosis not present

## 2016-05-28 DIAGNOSIS — R278 Other lack of coordination: Secondary | ICD-10-CM | POA: Diagnosis not present

## 2016-05-28 DIAGNOSIS — N3946 Mixed incontinence: Secondary | ICD-10-CM | POA: Diagnosis not present

## 2016-05-29 DIAGNOSIS — R262 Difficulty in walking, not elsewhere classified: Secondary | ICD-10-CM | POA: Diagnosis not present

## 2016-05-29 DIAGNOSIS — R260 Ataxic gait: Secondary | ICD-10-CM | POA: Diagnosis not present

## 2016-05-29 DIAGNOSIS — R278 Other lack of coordination: Secondary | ICD-10-CM | POA: Diagnosis not present

## 2016-05-29 DIAGNOSIS — M6281 Muscle weakness (generalized): Secondary | ICD-10-CM | POA: Diagnosis not present

## 2016-05-29 DIAGNOSIS — Z9181 History of falling: Secondary | ICD-10-CM | POA: Diagnosis not present

## 2016-05-29 DIAGNOSIS — N3946 Mixed incontinence: Secondary | ICD-10-CM | POA: Diagnosis not present

## 2016-05-31 DIAGNOSIS — M6281 Muscle weakness (generalized): Secondary | ICD-10-CM | POA: Diagnosis not present

## 2016-05-31 DIAGNOSIS — R748 Abnormal levels of other serum enzymes: Secondary | ICD-10-CM | POA: Diagnosis not present

## 2016-05-31 DIAGNOSIS — R4189 Other symptoms and signs involving cognitive functions and awareness: Secondary | ICD-10-CM | POA: Diagnosis not present

## 2016-05-31 DIAGNOSIS — W19XXXA Unspecified fall, initial encounter: Secondary | ICD-10-CM | POA: Diagnosis not present

## 2016-05-31 DIAGNOSIS — R278 Other lack of coordination: Secondary | ICD-10-CM | POA: Diagnosis not present

## 2016-05-31 DIAGNOSIS — Z9181 History of falling: Secondary | ICD-10-CM | POA: Diagnosis not present

## 2016-05-31 DIAGNOSIS — N3946 Mixed incontinence: Secondary | ICD-10-CM | POA: Diagnosis not present

## 2016-05-31 DIAGNOSIS — M810 Age-related osteoporosis without current pathological fracture: Secondary | ICD-10-CM | POA: Diagnosis not present

## 2016-05-31 DIAGNOSIS — M79651 Pain in right thigh: Secondary | ICD-10-CM | POA: Diagnosis not present

## 2016-06-04 DIAGNOSIS — N3946 Mixed incontinence: Secondary | ICD-10-CM | POA: Diagnosis not present

## 2016-06-04 DIAGNOSIS — M6281 Muscle weakness (generalized): Secondary | ICD-10-CM | POA: Diagnosis not present

## 2016-06-04 DIAGNOSIS — R278 Other lack of coordination: Secondary | ICD-10-CM | POA: Diagnosis not present

## 2016-06-04 DIAGNOSIS — Z9181 History of falling: Secondary | ICD-10-CM | POA: Diagnosis not present

## 2016-06-06 DIAGNOSIS — M6281 Muscle weakness (generalized): Secondary | ICD-10-CM | POA: Diagnosis not present

## 2016-06-06 DIAGNOSIS — R278 Other lack of coordination: Secondary | ICD-10-CM | POA: Diagnosis not present

## 2016-06-06 DIAGNOSIS — N3946 Mixed incontinence: Secondary | ICD-10-CM | POA: Diagnosis not present

## 2016-06-06 DIAGNOSIS — Z9181 History of falling: Secondary | ICD-10-CM | POA: Diagnosis not present

## 2016-06-08 DIAGNOSIS — N3946 Mixed incontinence: Secondary | ICD-10-CM | POA: Diagnosis not present

## 2016-06-08 DIAGNOSIS — Z9181 History of falling: Secondary | ICD-10-CM | POA: Diagnosis not present

## 2016-06-08 DIAGNOSIS — R278 Other lack of coordination: Secondary | ICD-10-CM | POA: Diagnosis not present

## 2016-06-08 DIAGNOSIS — M6281 Muscle weakness (generalized): Secondary | ICD-10-CM | POA: Diagnosis not present

## 2016-06-11 DIAGNOSIS — Z9181 History of falling: Secondary | ICD-10-CM | POA: Diagnosis not present

## 2016-06-11 DIAGNOSIS — R278 Other lack of coordination: Secondary | ICD-10-CM | POA: Diagnosis not present

## 2016-06-11 DIAGNOSIS — N3946 Mixed incontinence: Secondary | ICD-10-CM | POA: Diagnosis not present

## 2016-06-11 DIAGNOSIS — M6281 Muscle weakness (generalized): Secondary | ICD-10-CM | POA: Diagnosis not present

## 2016-06-13 DIAGNOSIS — N3946 Mixed incontinence: Secondary | ICD-10-CM | POA: Diagnosis not present

## 2016-06-13 DIAGNOSIS — Z9181 History of falling: Secondary | ICD-10-CM | POA: Diagnosis not present

## 2016-06-13 DIAGNOSIS — M6281 Muscle weakness (generalized): Secondary | ICD-10-CM | POA: Diagnosis not present

## 2016-06-13 DIAGNOSIS — R278 Other lack of coordination: Secondary | ICD-10-CM | POA: Diagnosis not present

## 2016-06-15 DIAGNOSIS — M6281 Muscle weakness (generalized): Secondary | ICD-10-CM | POA: Diagnosis not present

## 2016-06-15 DIAGNOSIS — N3946 Mixed incontinence: Secondary | ICD-10-CM | POA: Diagnosis not present

## 2016-06-15 DIAGNOSIS — Z9181 History of falling: Secondary | ICD-10-CM | POA: Diagnosis not present

## 2016-06-15 DIAGNOSIS — R278 Other lack of coordination: Secondary | ICD-10-CM | POA: Diagnosis not present

## 2016-06-20 DIAGNOSIS — R278 Other lack of coordination: Secondary | ICD-10-CM | POA: Diagnosis not present

## 2016-06-20 DIAGNOSIS — N3946 Mixed incontinence: Secondary | ICD-10-CM | POA: Diagnosis not present

## 2016-06-20 DIAGNOSIS — M6281 Muscle weakness (generalized): Secondary | ICD-10-CM | POA: Diagnosis not present

## 2016-06-20 DIAGNOSIS — Z9181 History of falling: Secondary | ICD-10-CM | POA: Diagnosis not present

## 2016-06-21 DIAGNOSIS — N3946 Mixed incontinence: Secondary | ICD-10-CM | POA: Diagnosis not present

## 2016-06-21 DIAGNOSIS — M6281 Muscle weakness (generalized): Secondary | ICD-10-CM | POA: Diagnosis not present

## 2016-06-21 DIAGNOSIS — R278 Other lack of coordination: Secondary | ICD-10-CM | POA: Diagnosis not present

## 2016-06-21 DIAGNOSIS — Z9181 History of falling: Secondary | ICD-10-CM | POA: Diagnosis not present

## 2016-06-22 DIAGNOSIS — Z9181 History of falling: Secondary | ICD-10-CM | POA: Diagnosis not present

## 2016-06-22 DIAGNOSIS — R278 Other lack of coordination: Secondary | ICD-10-CM | POA: Diagnosis not present

## 2016-06-22 DIAGNOSIS — N3946 Mixed incontinence: Secondary | ICD-10-CM | POA: Diagnosis not present

## 2016-06-22 DIAGNOSIS — M6281 Muscle weakness (generalized): Secondary | ICD-10-CM | POA: Diagnosis not present

## 2016-06-25 DIAGNOSIS — R278 Other lack of coordination: Secondary | ICD-10-CM | POA: Diagnosis not present

## 2016-06-25 DIAGNOSIS — Z9181 History of falling: Secondary | ICD-10-CM | POA: Diagnosis not present

## 2016-06-25 DIAGNOSIS — N3946 Mixed incontinence: Secondary | ICD-10-CM | POA: Diagnosis not present

## 2016-06-25 DIAGNOSIS — M6281 Muscle weakness (generalized): Secondary | ICD-10-CM | POA: Diagnosis not present

## 2016-06-27 DIAGNOSIS — R278 Other lack of coordination: Secondary | ICD-10-CM | POA: Diagnosis not present

## 2016-06-27 DIAGNOSIS — M6281 Muscle weakness (generalized): Secondary | ICD-10-CM | POA: Diagnosis not present

## 2016-06-27 DIAGNOSIS — N3946 Mixed incontinence: Secondary | ICD-10-CM | POA: Diagnosis not present

## 2016-06-27 DIAGNOSIS — Z9181 History of falling: Secondary | ICD-10-CM | POA: Diagnosis not present

## 2016-06-29 DIAGNOSIS — Z9181 History of falling: Secondary | ICD-10-CM | POA: Diagnosis not present

## 2016-06-29 DIAGNOSIS — N3946 Mixed incontinence: Secondary | ICD-10-CM | POA: Diagnosis not present

## 2016-06-29 DIAGNOSIS — M6281 Muscle weakness (generalized): Secondary | ICD-10-CM | POA: Diagnosis not present

## 2016-06-29 DIAGNOSIS — R278 Other lack of coordination: Secondary | ICD-10-CM | POA: Diagnosis not present

## 2016-06-29 DIAGNOSIS — R262 Difficulty in walking, not elsewhere classified: Secondary | ICD-10-CM | POA: Diagnosis not present

## 2016-06-29 DIAGNOSIS — R260 Ataxic gait: Secondary | ICD-10-CM | POA: Diagnosis not present

## 2016-07-02 DIAGNOSIS — N3946 Mixed incontinence: Secondary | ICD-10-CM | POA: Diagnosis not present

## 2016-07-02 DIAGNOSIS — R278 Other lack of coordination: Secondary | ICD-10-CM | POA: Diagnosis not present

## 2016-07-02 DIAGNOSIS — Z9181 History of falling: Secondary | ICD-10-CM | POA: Diagnosis not present

## 2016-07-02 DIAGNOSIS — R260 Ataxic gait: Secondary | ICD-10-CM | POA: Diagnosis not present

## 2016-07-02 DIAGNOSIS — R262 Difficulty in walking, not elsewhere classified: Secondary | ICD-10-CM | POA: Diagnosis not present

## 2016-07-02 DIAGNOSIS — M6281 Muscle weakness (generalized): Secondary | ICD-10-CM | POA: Diagnosis not present

## 2016-07-03 DIAGNOSIS — N3946 Mixed incontinence: Secondary | ICD-10-CM | POA: Diagnosis not present

## 2016-07-03 DIAGNOSIS — R278 Other lack of coordination: Secondary | ICD-10-CM | POA: Diagnosis not present

## 2016-07-03 DIAGNOSIS — R262 Difficulty in walking, not elsewhere classified: Secondary | ICD-10-CM | POA: Diagnosis not present

## 2016-07-03 DIAGNOSIS — R260 Ataxic gait: Secondary | ICD-10-CM | POA: Diagnosis not present

## 2016-07-03 DIAGNOSIS — Z9181 History of falling: Secondary | ICD-10-CM | POA: Diagnosis not present

## 2016-07-03 DIAGNOSIS — M6281 Muscle weakness (generalized): Secondary | ICD-10-CM | POA: Diagnosis not present

## 2016-07-04 DIAGNOSIS — R278 Other lack of coordination: Secondary | ICD-10-CM | POA: Diagnosis not present

## 2016-07-04 DIAGNOSIS — Z9181 History of falling: Secondary | ICD-10-CM | POA: Diagnosis not present

## 2016-07-04 DIAGNOSIS — R262 Difficulty in walking, not elsewhere classified: Secondary | ICD-10-CM | POA: Diagnosis not present

## 2016-07-04 DIAGNOSIS — R260 Ataxic gait: Secondary | ICD-10-CM | POA: Diagnosis not present

## 2016-07-04 DIAGNOSIS — N3946 Mixed incontinence: Secondary | ICD-10-CM | POA: Diagnosis not present

## 2016-07-04 DIAGNOSIS — M6281 Muscle weakness (generalized): Secondary | ICD-10-CM | POA: Diagnosis not present

## 2016-07-06 DIAGNOSIS — F0390 Unspecified dementia without behavioral disturbance: Secondary | ICD-10-CM | POA: Diagnosis not present

## 2016-07-06 DIAGNOSIS — G250 Essential tremor: Secondary | ICD-10-CM | POA: Diagnosis not present

## 2016-07-06 DIAGNOSIS — R4189 Other symptoms and signs involving cognitive functions and awareness: Secondary | ICD-10-CM | POA: Diagnosis not present

## 2016-07-06 DIAGNOSIS — R4182 Altered mental status, unspecified: Secondary | ICD-10-CM | POA: Diagnosis not present

## 2016-07-06 DIAGNOSIS — G9389 Other specified disorders of brain: Secondary | ICD-10-CM | POA: Diagnosis not present

## 2016-07-06 DIAGNOSIS — F0281 Dementia in other diseases classified elsewhere with behavioral disturbance: Secondary | ICD-10-CM | POA: Diagnosis not present

## 2016-07-06 DIAGNOSIS — G3109 Other frontotemporal dementia: Secondary | ICD-10-CM | POA: Diagnosis not present

## 2016-07-06 DIAGNOSIS — I771 Stricture of artery: Secondary | ICD-10-CM | POA: Diagnosis not present

## 2016-07-06 DIAGNOSIS — G43009 Migraine without aura, not intractable, without status migrainosus: Secondary | ICD-10-CM | POA: Diagnosis not present

## 2016-07-06 DIAGNOSIS — Z9183 Wandering in diseases classified elsewhere: Secondary | ICD-10-CM | POA: Diagnosis not present

## 2016-07-06 DIAGNOSIS — N3000 Acute cystitis without hematuria: Secondary | ICD-10-CM | POA: Diagnosis not present

## 2016-07-06 DIAGNOSIS — I680 Cerebral amyloid angiopathy: Secondary | ICD-10-CM | POA: Diagnosis not present

## 2016-07-11 DIAGNOSIS — N3946 Mixed incontinence: Secondary | ICD-10-CM | POA: Diagnosis not present

## 2016-07-11 DIAGNOSIS — Z9181 History of falling: Secondary | ICD-10-CM | POA: Diagnosis not present

## 2016-07-11 DIAGNOSIS — M6281 Muscle weakness (generalized): Secondary | ICD-10-CM | POA: Diagnosis not present

## 2016-07-11 DIAGNOSIS — R260 Ataxic gait: Secondary | ICD-10-CM | POA: Diagnosis not present

## 2016-07-11 DIAGNOSIS — R262 Difficulty in walking, not elsewhere classified: Secondary | ICD-10-CM | POA: Diagnosis not present

## 2016-07-11 DIAGNOSIS — R278 Other lack of coordination: Secondary | ICD-10-CM | POA: Diagnosis not present

## 2016-07-12 DIAGNOSIS — R262 Difficulty in walking, not elsewhere classified: Secondary | ICD-10-CM | POA: Diagnosis not present

## 2016-07-12 DIAGNOSIS — Z9181 History of falling: Secondary | ICD-10-CM | POA: Diagnosis not present

## 2016-07-12 DIAGNOSIS — R278 Other lack of coordination: Secondary | ICD-10-CM | POA: Diagnosis not present

## 2016-07-12 DIAGNOSIS — R260 Ataxic gait: Secondary | ICD-10-CM | POA: Diagnosis not present

## 2016-07-12 DIAGNOSIS — M6281 Muscle weakness (generalized): Secondary | ICD-10-CM | POA: Diagnosis not present

## 2016-07-12 DIAGNOSIS — N3946 Mixed incontinence: Secondary | ICD-10-CM | POA: Diagnosis not present

## 2016-07-13 DIAGNOSIS — R260 Ataxic gait: Secondary | ICD-10-CM | POA: Diagnosis not present

## 2016-07-13 DIAGNOSIS — R262 Difficulty in walking, not elsewhere classified: Secondary | ICD-10-CM | POA: Diagnosis not present

## 2016-07-13 DIAGNOSIS — Z9181 History of falling: Secondary | ICD-10-CM | POA: Diagnosis not present

## 2016-07-13 DIAGNOSIS — N3946 Mixed incontinence: Secondary | ICD-10-CM | POA: Diagnosis not present

## 2016-07-13 DIAGNOSIS — M6281 Muscle weakness (generalized): Secondary | ICD-10-CM | POA: Diagnosis not present

## 2016-07-13 DIAGNOSIS — R278 Other lack of coordination: Secondary | ICD-10-CM | POA: Diagnosis not present

## 2016-07-16 DIAGNOSIS — Z9181 History of falling: Secondary | ICD-10-CM | POA: Diagnosis not present

## 2016-07-16 DIAGNOSIS — R262 Difficulty in walking, not elsewhere classified: Secondary | ICD-10-CM | POA: Diagnosis not present

## 2016-07-16 DIAGNOSIS — M6281 Muscle weakness (generalized): Secondary | ICD-10-CM | POA: Diagnosis not present

## 2016-07-16 DIAGNOSIS — N3946 Mixed incontinence: Secondary | ICD-10-CM | POA: Diagnosis not present

## 2016-07-16 DIAGNOSIS — R260 Ataxic gait: Secondary | ICD-10-CM | POA: Diagnosis not present

## 2016-07-16 DIAGNOSIS — R278 Other lack of coordination: Secondary | ICD-10-CM | POA: Diagnosis not present

## 2016-07-18 DIAGNOSIS — R278 Other lack of coordination: Secondary | ICD-10-CM | POA: Diagnosis not present

## 2016-07-18 DIAGNOSIS — R262 Difficulty in walking, not elsewhere classified: Secondary | ICD-10-CM | POA: Diagnosis not present

## 2016-07-18 DIAGNOSIS — M6281 Muscle weakness (generalized): Secondary | ICD-10-CM | POA: Diagnosis not present

## 2016-07-18 DIAGNOSIS — R260 Ataxic gait: Secondary | ICD-10-CM | POA: Diagnosis not present

## 2016-07-18 DIAGNOSIS — N3946 Mixed incontinence: Secondary | ICD-10-CM | POA: Diagnosis not present

## 2016-07-18 DIAGNOSIS — Z9181 History of falling: Secondary | ICD-10-CM | POA: Diagnosis not present

## 2016-07-20 DIAGNOSIS — R262 Difficulty in walking, not elsewhere classified: Secondary | ICD-10-CM | POA: Diagnosis not present

## 2016-07-20 DIAGNOSIS — M6281 Muscle weakness (generalized): Secondary | ICD-10-CM | POA: Diagnosis not present

## 2016-07-20 DIAGNOSIS — N3946 Mixed incontinence: Secondary | ICD-10-CM | POA: Diagnosis not present

## 2016-07-20 DIAGNOSIS — R260 Ataxic gait: Secondary | ICD-10-CM | POA: Diagnosis not present

## 2016-07-20 DIAGNOSIS — R278 Other lack of coordination: Secondary | ICD-10-CM | POA: Diagnosis not present

## 2016-07-20 DIAGNOSIS — Z9181 History of falling: Secondary | ICD-10-CM | POA: Diagnosis not present

## 2016-07-23 DIAGNOSIS — R260 Ataxic gait: Secondary | ICD-10-CM | POA: Diagnosis not present

## 2016-07-23 DIAGNOSIS — M6281 Muscle weakness (generalized): Secondary | ICD-10-CM | POA: Diagnosis not present

## 2016-07-23 DIAGNOSIS — R262 Difficulty in walking, not elsewhere classified: Secondary | ICD-10-CM | POA: Diagnosis not present

## 2016-07-23 DIAGNOSIS — N3946 Mixed incontinence: Secondary | ICD-10-CM | POA: Diagnosis not present

## 2016-07-23 DIAGNOSIS — Z9181 History of falling: Secondary | ICD-10-CM | POA: Diagnosis not present

## 2016-07-23 DIAGNOSIS — R278 Other lack of coordination: Secondary | ICD-10-CM | POA: Diagnosis not present

## 2016-07-24 DIAGNOSIS — R262 Difficulty in walking, not elsewhere classified: Secondary | ICD-10-CM | POA: Diagnosis not present

## 2016-07-24 DIAGNOSIS — N3946 Mixed incontinence: Secondary | ICD-10-CM | POA: Diagnosis not present

## 2016-07-24 DIAGNOSIS — R278 Other lack of coordination: Secondary | ICD-10-CM | POA: Diagnosis not present

## 2016-07-24 DIAGNOSIS — Z9181 History of falling: Secondary | ICD-10-CM | POA: Diagnosis not present

## 2016-07-24 DIAGNOSIS — M6281 Muscle weakness (generalized): Secondary | ICD-10-CM | POA: Diagnosis not present

## 2016-07-24 DIAGNOSIS — Z111 Encounter for screening for respiratory tuberculosis: Secondary | ICD-10-CM | POA: Diagnosis not present

## 2016-07-24 DIAGNOSIS — R260 Ataxic gait: Secondary | ICD-10-CM | POA: Diagnosis not present

## 2016-07-25 DIAGNOSIS — R278 Other lack of coordination: Secondary | ICD-10-CM | POA: Diagnosis not present

## 2016-07-25 DIAGNOSIS — R260 Ataxic gait: Secondary | ICD-10-CM | POA: Diagnosis not present

## 2016-07-25 DIAGNOSIS — R262 Difficulty in walking, not elsewhere classified: Secondary | ICD-10-CM | POA: Diagnosis not present

## 2016-07-25 DIAGNOSIS — M6281 Muscle weakness (generalized): Secondary | ICD-10-CM | POA: Diagnosis not present

## 2016-07-25 DIAGNOSIS — N3946 Mixed incontinence: Secondary | ICD-10-CM | POA: Diagnosis not present

## 2016-07-25 DIAGNOSIS — Z9181 History of falling: Secondary | ICD-10-CM | POA: Diagnosis not present

## 2016-08-29 DIAGNOSIS — E038 Other specified hypothyroidism: Secondary | ICD-10-CM | POA: Diagnosis not present

## 2016-08-29 DIAGNOSIS — F0150 Vascular dementia without behavioral disturbance: Secondary | ICD-10-CM | POA: Diagnosis not present

## 2016-08-29 DIAGNOSIS — E784 Other hyperlipidemia: Secondary | ICD-10-CM | POA: Diagnosis not present

## 2016-08-29 DIAGNOSIS — G250 Essential tremor: Secondary | ICD-10-CM | POA: Diagnosis not present

## 2016-09-03 DIAGNOSIS — R5381 Other malaise: Secondary | ICD-10-CM | POA: Diagnosis not present

## 2016-09-03 DIAGNOSIS — R2689 Other abnormalities of gait and mobility: Secondary | ICD-10-CM | POA: Diagnosis not present

## 2016-09-03 DIAGNOSIS — R451 Restlessness and agitation: Secondary | ICD-10-CM | POA: Diagnosis not present

## 2016-09-03 DIAGNOSIS — E785 Hyperlipidemia, unspecified: Secondary | ICD-10-CM | POA: Diagnosis not present

## 2016-09-03 DIAGNOSIS — F419 Anxiety disorder, unspecified: Secondary | ICD-10-CM | POA: Diagnosis not present

## 2016-09-03 DIAGNOSIS — N3946 Mixed incontinence: Secondary | ICD-10-CM | POA: Diagnosis not present

## 2016-09-03 DIAGNOSIS — G250 Essential tremor: Secondary | ICD-10-CM | POA: Diagnosis not present

## 2016-09-03 DIAGNOSIS — F0150 Vascular dementia without behavioral disturbance: Secondary | ICD-10-CM | POA: Diagnosis not present

## 2016-09-03 DIAGNOSIS — E039 Hypothyroidism, unspecified: Secondary | ICD-10-CM | POA: Diagnosis not present

## 2016-09-05 DIAGNOSIS — F320 Major depressive disorder, single episode, mild: Secondary | ICD-10-CM | POA: Diagnosis not present

## 2016-09-05 DIAGNOSIS — F0150 Vascular dementia without behavioral disturbance: Secondary | ICD-10-CM | POA: Diagnosis not present

## 2016-09-05 DIAGNOSIS — R451 Restlessness and agitation: Secondary | ICD-10-CM | POA: Diagnosis not present

## 2016-09-05 DIAGNOSIS — G250 Essential tremor: Secondary | ICD-10-CM | POA: Diagnosis not present

## 2016-09-21 DIAGNOSIS — F419 Anxiety disorder, unspecified: Secondary | ICD-10-CM | POA: Diagnosis not present

## 2016-09-21 DIAGNOSIS — R5381 Other malaise: Secondary | ICD-10-CM | POA: Diagnosis not present

## 2016-09-21 DIAGNOSIS — R451 Restlessness and agitation: Secondary | ICD-10-CM | POA: Diagnosis not present

## 2016-09-21 DIAGNOSIS — F0150 Vascular dementia without behavioral disturbance: Secondary | ICD-10-CM | POA: Diagnosis not present

## 2016-09-21 DIAGNOSIS — G250 Essential tremor: Secondary | ICD-10-CM | POA: Diagnosis not present

## 2016-09-21 DIAGNOSIS — E785 Hyperlipidemia, unspecified: Secondary | ICD-10-CM | POA: Diagnosis not present

## 2016-09-21 DIAGNOSIS — E039 Hypothyroidism, unspecified: Secondary | ICD-10-CM | POA: Diagnosis not present

## 2016-09-21 DIAGNOSIS — N3946 Mixed incontinence: Secondary | ICD-10-CM | POA: Diagnosis not present

## 2016-09-21 DIAGNOSIS — R2689 Other abnormalities of gait and mobility: Secondary | ICD-10-CM | POA: Diagnosis not present

## 2016-09-25 DIAGNOSIS — N3946 Mixed incontinence: Secondary | ICD-10-CM | POA: Diagnosis not present

## 2016-09-25 DIAGNOSIS — R5381 Other malaise: Secondary | ICD-10-CM | POA: Diagnosis not present

## 2016-09-25 DIAGNOSIS — G250 Essential tremor: Secondary | ICD-10-CM | POA: Diagnosis not present

## 2016-09-25 DIAGNOSIS — F0150 Vascular dementia without behavioral disturbance: Secondary | ICD-10-CM | POA: Diagnosis not present

## 2016-09-25 DIAGNOSIS — E785 Hyperlipidemia, unspecified: Secondary | ICD-10-CM | POA: Diagnosis not present

## 2016-09-25 DIAGNOSIS — F419 Anxiety disorder, unspecified: Secondary | ICD-10-CM | POA: Diagnosis not present

## 2016-09-25 DIAGNOSIS — R2689 Other abnormalities of gait and mobility: Secondary | ICD-10-CM | POA: Diagnosis not present

## 2016-09-25 DIAGNOSIS — R451 Restlessness and agitation: Secondary | ICD-10-CM | POA: Diagnosis not present

## 2016-09-25 DIAGNOSIS — E039 Hypothyroidism, unspecified: Secondary | ICD-10-CM | POA: Diagnosis not present

## 2016-09-26 DIAGNOSIS — F419 Anxiety disorder, unspecified: Secondary | ICD-10-CM | POA: Diagnosis not present

## 2016-09-26 DIAGNOSIS — E785 Hyperlipidemia, unspecified: Secondary | ICD-10-CM | POA: Diagnosis not present

## 2016-09-26 DIAGNOSIS — G250 Essential tremor: Secondary | ICD-10-CM | POA: Diagnosis not present

## 2016-09-26 DIAGNOSIS — E039 Hypothyroidism, unspecified: Secondary | ICD-10-CM | POA: Diagnosis not present

## 2016-09-26 DIAGNOSIS — N3946 Mixed incontinence: Secondary | ICD-10-CM | POA: Diagnosis not present

## 2016-09-26 DIAGNOSIS — R5381 Other malaise: Secondary | ICD-10-CM | POA: Diagnosis not present

## 2016-09-26 DIAGNOSIS — R451 Restlessness and agitation: Secondary | ICD-10-CM | POA: Diagnosis not present

## 2016-09-26 DIAGNOSIS — R2689 Other abnormalities of gait and mobility: Secondary | ICD-10-CM | POA: Diagnosis not present

## 2016-09-26 DIAGNOSIS — F0150 Vascular dementia without behavioral disturbance: Secondary | ICD-10-CM | POA: Diagnosis not present

## 2016-09-27 DIAGNOSIS — E785 Hyperlipidemia, unspecified: Secondary | ICD-10-CM | POA: Diagnosis not present

## 2016-09-27 DIAGNOSIS — N3946 Mixed incontinence: Secondary | ICD-10-CM | POA: Diagnosis not present

## 2016-09-27 DIAGNOSIS — G250 Essential tremor: Secondary | ICD-10-CM | POA: Diagnosis not present

## 2016-09-27 DIAGNOSIS — R451 Restlessness and agitation: Secondary | ICD-10-CM | POA: Diagnosis not present

## 2016-09-27 DIAGNOSIS — R5381 Other malaise: Secondary | ICD-10-CM | POA: Diagnosis not present

## 2016-09-27 DIAGNOSIS — R2689 Other abnormalities of gait and mobility: Secondary | ICD-10-CM | POA: Diagnosis not present

## 2016-09-27 DIAGNOSIS — F0150 Vascular dementia without behavioral disturbance: Secondary | ICD-10-CM | POA: Diagnosis not present

## 2016-09-27 DIAGNOSIS — E039 Hypothyroidism, unspecified: Secondary | ICD-10-CM | POA: Diagnosis not present

## 2016-09-27 DIAGNOSIS — F419 Anxiety disorder, unspecified: Secondary | ICD-10-CM | POA: Diagnosis not present

## 2016-10-02 DIAGNOSIS — N3946 Mixed incontinence: Secondary | ICD-10-CM | POA: Diagnosis not present

## 2016-10-02 DIAGNOSIS — R451 Restlessness and agitation: Secondary | ICD-10-CM | POA: Diagnosis not present

## 2016-10-02 DIAGNOSIS — E039 Hypothyroidism, unspecified: Secondary | ICD-10-CM | POA: Diagnosis not present

## 2016-10-02 DIAGNOSIS — F419 Anxiety disorder, unspecified: Secondary | ICD-10-CM | POA: Diagnosis not present

## 2016-10-02 DIAGNOSIS — E785 Hyperlipidemia, unspecified: Secondary | ICD-10-CM | POA: Diagnosis not present

## 2016-10-02 DIAGNOSIS — R2689 Other abnormalities of gait and mobility: Secondary | ICD-10-CM | POA: Diagnosis not present

## 2016-10-02 DIAGNOSIS — G250 Essential tremor: Secondary | ICD-10-CM | POA: Diagnosis not present

## 2016-10-02 DIAGNOSIS — F0150 Vascular dementia without behavioral disturbance: Secondary | ICD-10-CM | POA: Diagnosis not present

## 2016-10-02 DIAGNOSIS — R5381 Other malaise: Secondary | ICD-10-CM | POA: Diagnosis not present

## 2016-10-05 DIAGNOSIS — E785 Hyperlipidemia, unspecified: Secondary | ICD-10-CM | POA: Diagnosis not present

## 2016-10-05 DIAGNOSIS — F419 Anxiety disorder, unspecified: Secondary | ICD-10-CM | POA: Diagnosis not present

## 2016-10-05 DIAGNOSIS — G250 Essential tremor: Secondary | ICD-10-CM | POA: Diagnosis not present

## 2016-10-05 DIAGNOSIS — R2689 Other abnormalities of gait and mobility: Secondary | ICD-10-CM | POA: Diagnosis not present

## 2016-10-05 DIAGNOSIS — E039 Hypothyroidism, unspecified: Secondary | ICD-10-CM | POA: Diagnosis not present

## 2016-10-05 DIAGNOSIS — R451 Restlessness and agitation: Secondary | ICD-10-CM | POA: Diagnosis not present

## 2016-10-05 DIAGNOSIS — F0150 Vascular dementia without behavioral disturbance: Secondary | ICD-10-CM | POA: Diagnosis not present

## 2016-10-05 DIAGNOSIS — N3946 Mixed incontinence: Secondary | ICD-10-CM | POA: Diagnosis not present

## 2016-10-05 DIAGNOSIS — R5381 Other malaise: Secondary | ICD-10-CM | POA: Diagnosis not present

## 2016-10-15 DIAGNOSIS — F0150 Vascular dementia without behavioral disturbance: Secondary | ICD-10-CM | POA: Diagnosis not present

## 2016-10-15 DIAGNOSIS — E039 Hypothyroidism, unspecified: Secondary | ICD-10-CM | POA: Diagnosis not present

## 2016-10-15 DIAGNOSIS — R451 Restlessness and agitation: Secondary | ICD-10-CM | POA: Diagnosis not present

## 2016-10-15 DIAGNOSIS — E785 Hyperlipidemia, unspecified: Secondary | ICD-10-CM | POA: Diagnosis not present

## 2016-10-15 DIAGNOSIS — N3946 Mixed incontinence: Secondary | ICD-10-CM | POA: Diagnosis not present

## 2016-10-15 DIAGNOSIS — R2689 Other abnormalities of gait and mobility: Secondary | ICD-10-CM | POA: Diagnosis not present

## 2016-10-15 DIAGNOSIS — F419 Anxiety disorder, unspecified: Secondary | ICD-10-CM | POA: Diagnosis not present

## 2016-10-15 DIAGNOSIS — G250 Essential tremor: Secondary | ICD-10-CM | POA: Diagnosis not present

## 2016-10-15 DIAGNOSIS — R5381 Other malaise: Secondary | ICD-10-CM | POA: Diagnosis not present

## 2016-10-18 DIAGNOSIS — E039 Hypothyroidism, unspecified: Secondary | ICD-10-CM | POA: Diagnosis not present

## 2016-10-18 DIAGNOSIS — N3946 Mixed incontinence: Secondary | ICD-10-CM | POA: Diagnosis not present

## 2016-10-18 DIAGNOSIS — E785 Hyperlipidemia, unspecified: Secondary | ICD-10-CM | POA: Diagnosis not present

## 2016-10-18 DIAGNOSIS — F0150 Vascular dementia without behavioral disturbance: Secondary | ICD-10-CM | POA: Diagnosis not present

## 2016-10-18 DIAGNOSIS — R2689 Other abnormalities of gait and mobility: Secondary | ICD-10-CM | POA: Diagnosis not present

## 2016-10-18 DIAGNOSIS — F419 Anxiety disorder, unspecified: Secondary | ICD-10-CM | POA: Diagnosis not present

## 2016-10-18 DIAGNOSIS — G250 Essential tremor: Secondary | ICD-10-CM | POA: Diagnosis not present

## 2016-10-18 DIAGNOSIS — R5381 Other malaise: Secondary | ICD-10-CM | POA: Diagnosis not present

## 2016-10-18 DIAGNOSIS — R451 Restlessness and agitation: Secondary | ICD-10-CM | POA: Diagnosis not present

## 2016-10-23 DIAGNOSIS — N3946 Mixed incontinence: Secondary | ICD-10-CM | POA: Diagnosis not present

## 2016-10-23 DIAGNOSIS — E039 Hypothyroidism, unspecified: Secondary | ICD-10-CM | POA: Diagnosis not present

## 2016-10-23 DIAGNOSIS — R5381 Other malaise: Secondary | ICD-10-CM | POA: Diagnosis not present

## 2016-10-23 DIAGNOSIS — R451 Restlessness and agitation: Secondary | ICD-10-CM | POA: Diagnosis not present

## 2016-10-23 DIAGNOSIS — F0150 Vascular dementia without behavioral disturbance: Secondary | ICD-10-CM | POA: Diagnosis not present

## 2016-10-23 DIAGNOSIS — R2689 Other abnormalities of gait and mobility: Secondary | ICD-10-CM | POA: Diagnosis not present

## 2016-10-23 DIAGNOSIS — G250 Essential tremor: Secondary | ICD-10-CM | POA: Diagnosis not present

## 2016-10-23 DIAGNOSIS — E785 Hyperlipidemia, unspecified: Secondary | ICD-10-CM | POA: Diagnosis not present

## 2016-10-23 DIAGNOSIS — F419 Anxiety disorder, unspecified: Secondary | ICD-10-CM | POA: Diagnosis not present

## 2016-10-24 DIAGNOSIS — F0150 Vascular dementia without behavioral disturbance: Secondary | ICD-10-CM | POA: Diagnosis not present

## 2016-10-24 DIAGNOSIS — F419 Anxiety disorder, unspecified: Secondary | ICD-10-CM | POA: Diagnosis not present

## 2016-10-24 DIAGNOSIS — R2689 Other abnormalities of gait and mobility: Secondary | ICD-10-CM | POA: Diagnosis not present

## 2016-10-24 DIAGNOSIS — E785 Hyperlipidemia, unspecified: Secondary | ICD-10-CM | POA: Diagnosis not present

## 2016-10-24 DIAGNOSIS — R451 Restlessness and agitation: Secondary | ICD-10-CM | POA: Diagnosis not present

## 2016-10-24 DIAGNOSIS — G250 Essential tremor: Secondary | ICD-10-CM | POA: Diagnosis not present

## 2016-10-24 DIAGNOSIS — N3946 Mixed incontinence: Secondary | ICD-10-CM | POA: Diagnosis not present

## 2016-10-24 DIAGNOSIS — R5381 Other malaise: Secondary | ICD-10-CM | POA: Diagnosis not present

## 2016-10-24 DIAGNOSIS — E039 Hypothyroidism, unspecified: Secondary | ICD-10-CM | POA: Diagnosis not present

## 2016-10-27 DIAGNOSIS — E039 Hypothyroidism, unspecified: Secondary | ICD-10-CM | POA: Diagnosis not present

## 2016-10-27 DIAGNOSIS — F0150 Vascular dementia without behavioral disturbance: Secondary | ICD-10-CM | POA: Diagnosis not present

## 2016-10-27 DIAGNOSIS — R2689 Other abnormalities of gait and mobility: Secondary | ICD-10-CM | POA: Diagnosis not present

## 2016-10-27 DIAGNOSIS — N3946 Mixed incontinence: Secondary | ICD-10-CM | POA: Diagnosis not present

## 2016-10-27 DIAGNOSIS — R5381 Other malaise: Secondary | ICD-10-CM | POA: Diagnosis not present

## 2016-10-27 DIAGNOSIS — E785 Hyperlipidemia, unspecified: Secondary | ICD-10-CM | POA: Diagnosis not present

## 2016-10-27 DIAGNOSIS — R451 Restlessness and agitation: Secondary | ICD-10-CM | POA: Diagnosis not present

## 2016-10-27 DIAGNOSIS — F419 Anxiety disorder, unspecified: Secondary | ICD-10-CM | POA: Diagnosis not present

## 2016-10-27 DIAGNOSIS — G250 Essential tremor: Secondary | ICD-10-CM | POA: Diagnosis not present

## 2016-11-01 DIAGNOSIS — R451 Restlessness and agitation: Secondary | ICD-10-CM | POA: Diagnosis not present

## 2016-11-01 DIAGNOSIS — R2689 Other abnormalities of gait and mobility: Secondary | ICD-10-CM | POA: Diagnosis not present

## 2016-11-01 DIAGNOSIS — E039 Hypothyroidism, unspecified: Secondary | ICD-10-CM | POA: Diagnosis not present

## 2016-11-01 DIAGNOSIS — N3946 Mixed incontinence: Secondary | ICD-10-CM | POA: Diagnosis not present

## 2016-11-01 DIAGNOSIS — F0150 Vascular dementia without behavioral disturbance: Secondary | ICD-10-CM | POA: Diagnosis not present

## 2016-11-01 DIAGNOSIS — E785 Hyperlipidemia, unspecified: Secondary | ICD-10-CM | POA: Diagnosis not present

## 2016-11-01 DIAGNOSIS — F419 Anxiety disorder, unspecified: Secondary | ICD-10-CM | POA: Diagnosis not present

## 2016-11-01 DIAGNOSIS — R5381 Other malaise: Secondary | ICD-10-CM | POA: Diagnosis not present

## 2016-11-01 DIAGNOSIS — G250 Essential tremor: Secondary | ICD-10-CM | POA: Diagnosis not present

## 2016-11-02 IMAGING — RF DG ESOPHAGUS
9 of 12 series · 13 of 24 positions shown · non-contrast
Comparison: Chest radiographs 11/16/2014

CLINICAL DATA: Epigastric discomfort with pain and nausea. Anxiety.
History of CVA. Initial encounter.

EXAM:
ESOPHOGRAM / BARIUM SWALLOW / BARIUM TABLET STUDY
TECHNIQUE: Combined double contrast and single contrast examination performed
using thick and thin barium liquid. The patient was observed with
fluoroscopy swallowing a 13 mm barium sulphate tablet.
FLUOROSCOPY TIME:  Radiation Exposure Index (as provided by the
fluoroscopic device): 259.14 uGy*m2
If the device does not provide the exposure index:
Fluoroscopy Time:  1 minutes and 18 seconds
Number of Acquired Images:  0 non fluoro store images

[Series 1: cp_standard · 0.53mm/px · 2 of 56 frames shown (1 of 9)]
[frame 9/56]
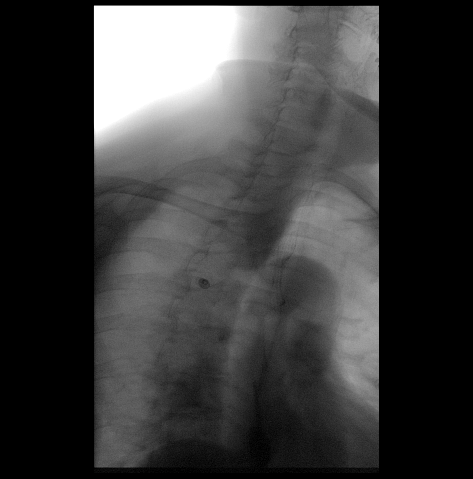
[frame 46/56]
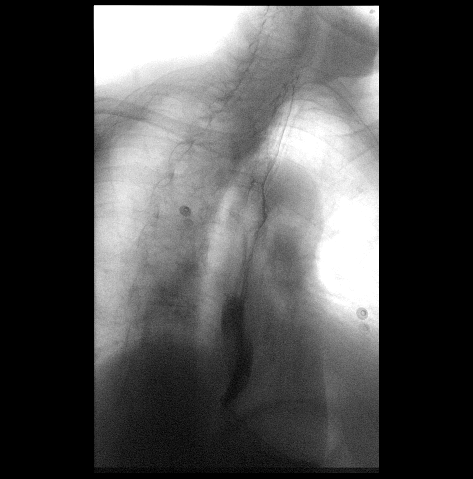

[Series 2: cp_standard · 0.53mm/px · 2 of 35 frames shown (2 of 9)]
[frame 18/35]
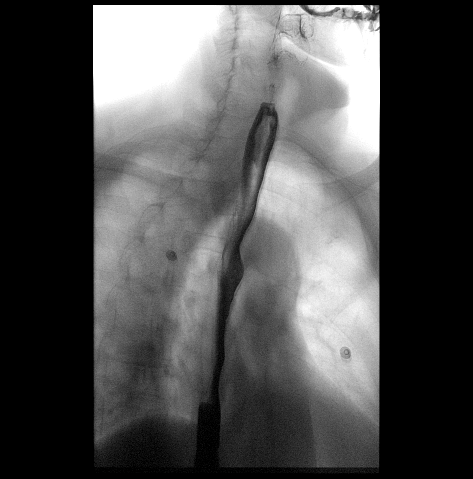
[frame 30/35]
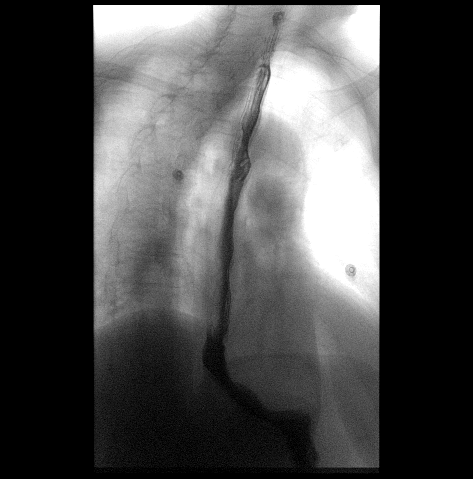

[Series 4: cp_standard · 0.36mm/px · 2 of 28 frames shown (3 of 9)]
[frame 2/28]
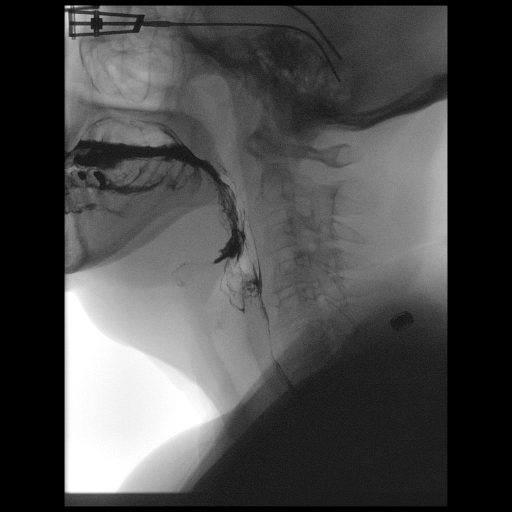
[frame 24/28]
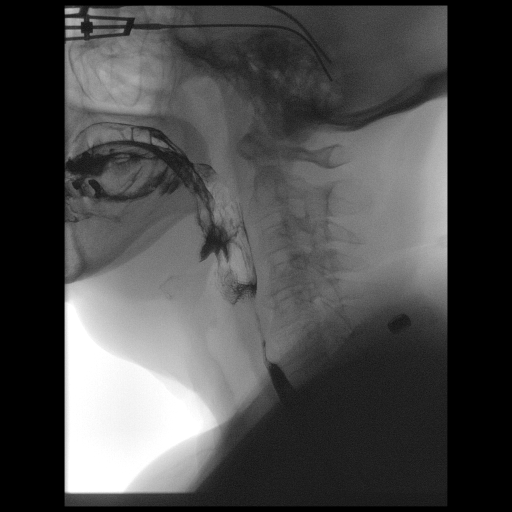

[Series 5: cp_standard · 0.60mm/px · 2 of 39 frames shown (4 of 9)]
[frame 20/39]
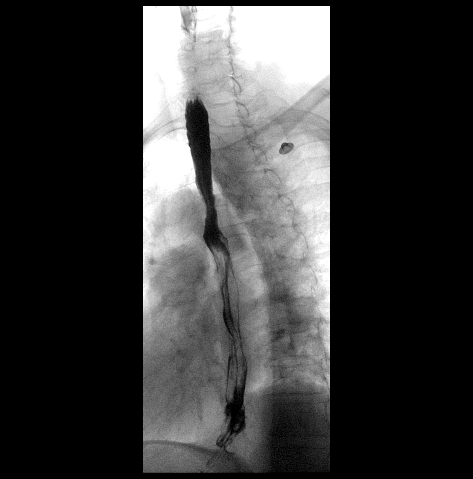
[frame 30/39]
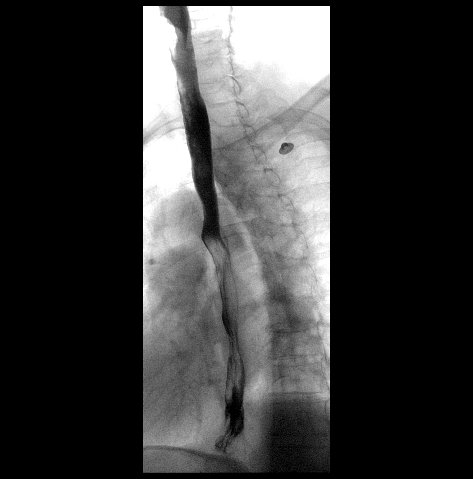

[Series 7: cp_standard · 0.20mm/px · 1 of 1 slices shown (5 of 9)]
[im 1/1]
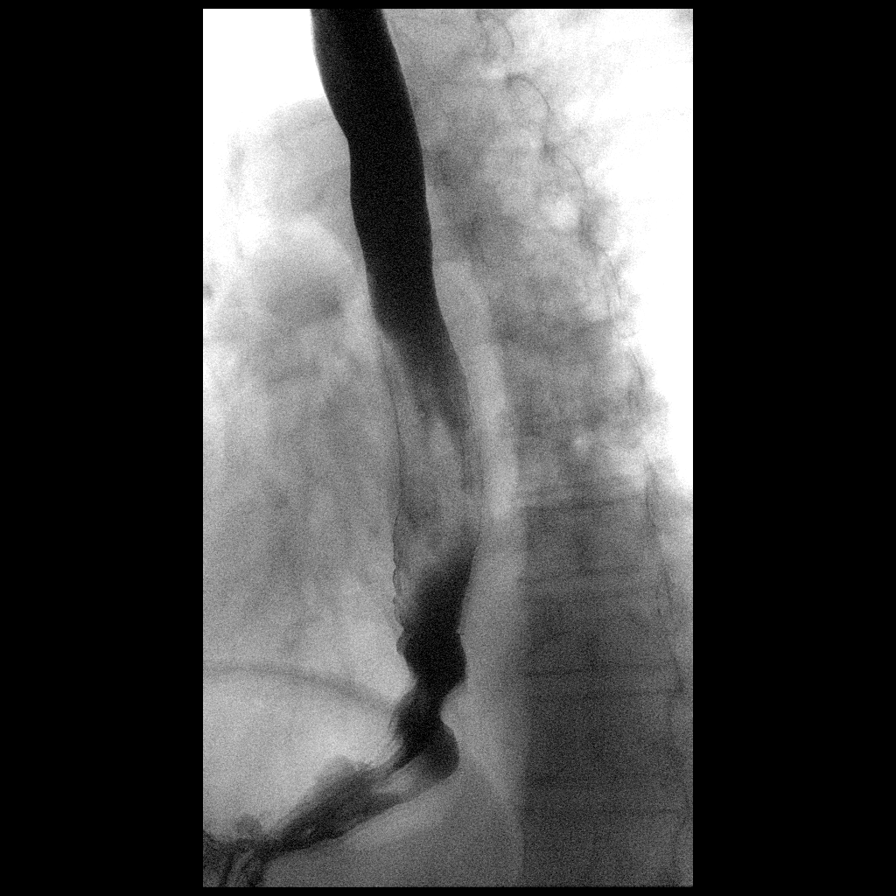

[Series 9: cp_standard · 0.20mm/px · 1 of 1 slices shown (6 of 9)]
[im 1/1]
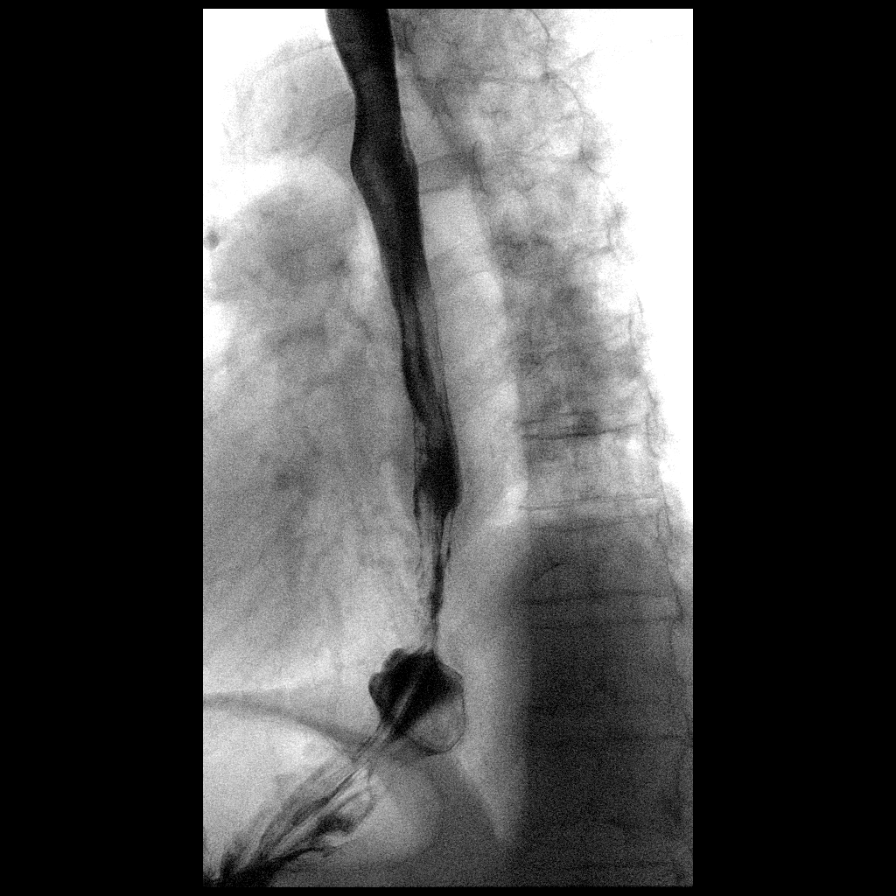

[Series 10: cp_standard · 0.40mm/px · 1 of 30 frames shown (7 of 9)]
[frame 16/30]
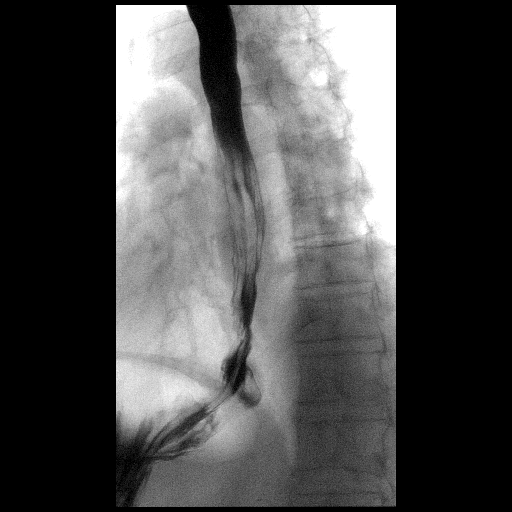

[Series 12: cp_standard · 0.20mm/px · 1 of 1 slices shown (8 of 9)]
[im 1/1]
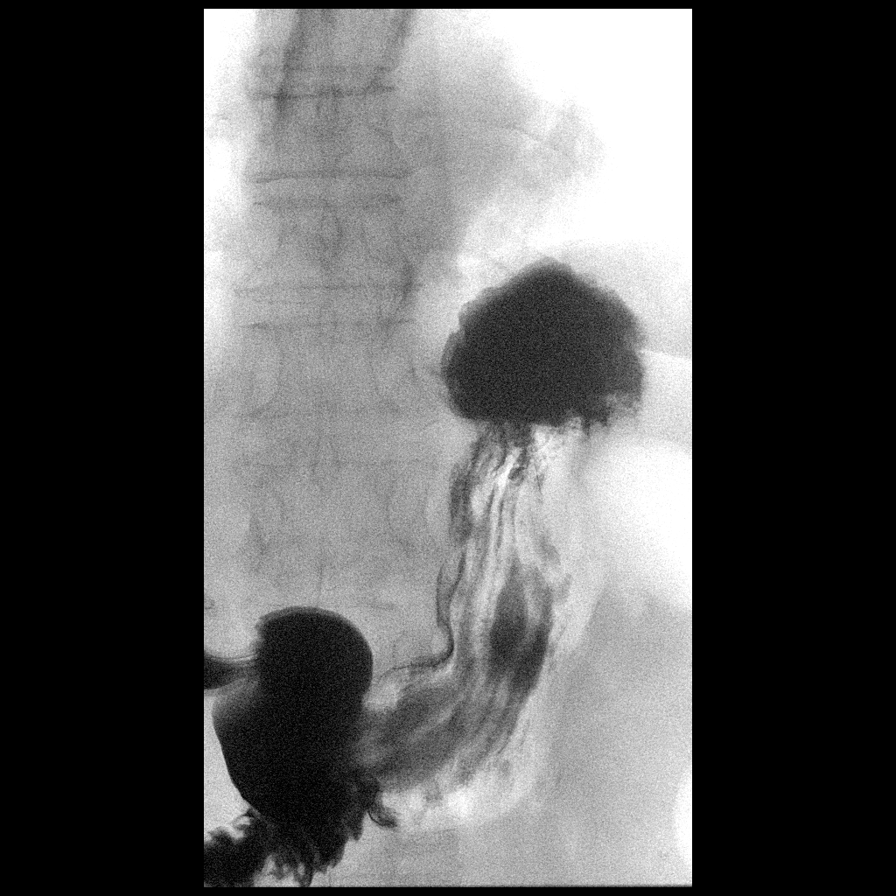

[Series 14: cp_standard · 0.18mm/px · 1 of 1 slices shown (9 of 9)]
[im 1/1]
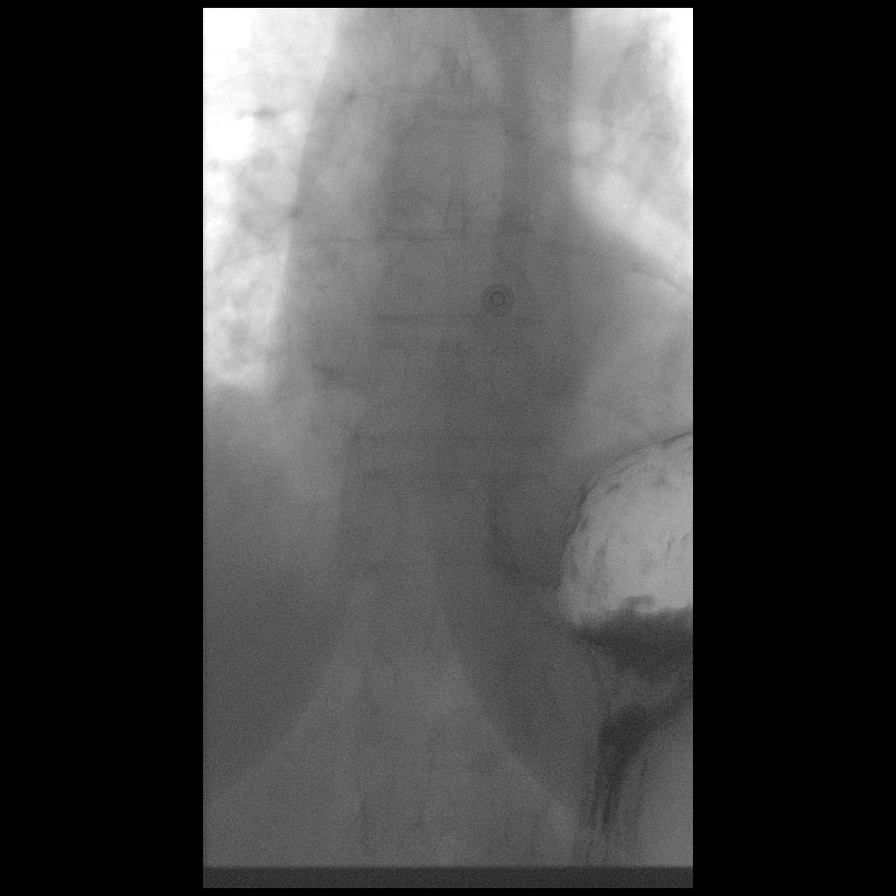

[13 of 24 positions shown; findings below may reference images not displayed]

FINDINGS: The patient had some difficulty following the instructions. There is
a mildly decreased primary stripping wave with otherwise normal
esophageal motility for age. Rapid sequence imaging of the pharynx
in the AP and lateral projections demonstrates no abnormalities.
There was no laryngeal penetration.

There is a small reducible hiatal hernia. No evidence of mucosal
ulceration, stricture or mass. No reflux elicited with the water
siphon test. A 13 mm barium tablet was administered and passed
without delay into the stomach.
IMPRESSION: No significant findings identified.  Mild presbyesophagus.

## 2016-11-03 DIAGNOSIS — E039 Hypothyroidism, unspecified: Secondary | ICD-10-CM | POA: Diagnosis not present

## 2016-11-03 DIAGNOSIS — F419 Anxiety disorder, unspecified: Secondary | ICD-10-CM | POA: Diagnosis not present

## 2016-11-03 DIAGNOSIS — R2689 Other abnormalities of gait and mobility: Secondary | ICD-10-CM | POA: Diagnosis not present

## 2016-11-03 DIAGNOSIS — F0150 Vascular dementia without behavioral disturbance: Secondary | ICD-10-CM | POA: Diagnosis not present

## 2016-11-03 DIAGNOSIS — G250 Essential tremor: Secondary | ICD-10-CM | POA: Diagnosis not present

## 2016-11-03 DIAGNOSIS — E785 Hyperlipidemia, unspecified: Secondary | ICD-10-CM | POA: Diagnosis not present

## 2016-11-03 DIAGNOSIS — N3946 Mixed incontinence: Secondary | ICD-10-CM | POA: Diagnosis not present

## 2016-11-03 DIAGNOSIS — R451 Restlessness and agitation: Secondary | ICD-10-CM | POA: Diagnosis not present

## 2016-11-03 DIAGNOSIS — R5381 Other malaise: Secondary | ICD-10-CM | POA: Diagnosis not present

## 2016-11-10 DIAGNOSIS — R451 Restlessness and agitation: Secondary | ICD-10-CM | POA: Diagnosis not present

## 2016-11-10 DIAGNOSIS — F0150 Vascular dementia without behavioral disturbance: Secondary | ICD-10-CM | POA: Diagnosis not present

## 2016-11-10 DIAGNOSIS — N3946 Mixed incontinence: Secondary | ICD-10-CM | POA: Diagnosis not present

## 2016-11-10 DIAGNOSIS — E785 Hyperlipidemia, unspecified: Secondary | ICD-10-CM | POA: Diagnosis not present

## 2016-11-10 DIAGNOSIS — F419 Anxiety disorder, unspecified: Secondary | ICD-10-CM | POA: Diagnosis not present

## 2016-11-10 DIAGNOSIS — E039 Hypothyroidism, unspecified: Secondary | ICD-10-CM | POA: Diagnosis not present

## 2016-11-10 DIAGNOSIS — G250 Essential tremor: Secondary | ICD-10-CM | POA: Diagnosis not present

## 2016-11-10 DIAGNOSIS — R5381 Other malaise: Secondary | ICD-10-CM | POA: Diagnosis not present

## 2016-11-10 DIAGNOSIS — R2689 Other abnormalities of gait and mobility: Secondary | ICD-10-CM | POA: Diagnosis not present

## 2016-11-16 DIAGNOSIS — N941 Unspecified dyspareunia: Secondary | ICD-10-CM | POA: Diagnosis not present

## 2016-11-16 DIAGNOSIS — R5381 Other malaise: Secondary | ICD-10-CM | POA: Diagnosis not present

## 2017-01-23 DIAGNOSIS — F0150 Vascular dementia without behavioral disturbance: Secondary | ICD-10-CM | POA: Diagnosis not present

## 2017-01-23 DIAGNOSIS — F320 Major depressive disorder, single episode, mild: Secondary | ICD-10-CM | POA: Diagnosis not present

## 2017-01-23 DIAGNOSIS — F418 Other specified anxiety disorders: Secondary | ICD-10-CM | POA: Diagnosis not present

## 2017-01-23 DIAGNOSIS — R600 Localized edema: Secondary | ICD-10-CM | POA: Diagnosis not present

## 2017-04-17 DIAGNOSIS — L308 Other specified dermatitis: Secondary | ICD-10-CM | POA: Diagnosis not present

## 2017-04-24 DIAGNOSIS — L03115 Cellulitis of right lower limb: Secondary | ICD-10-CM | POA: Diagnosis not present

## 2017-04-24 DIAGNOSIS — R600 Localized edema: Secondary | ICD-10-CM | POA: Diagnosis not present

## 2017-04-24 DIAGNOSIS — L03116 Cellulitis of left lower limb: Secondary | ICD-10-CM | POA: Diagnosis not present

## 2017-04-24 DIAGNOSIS — F0150 Vascular dementia without behavioral disturbance: Secondary | ICD-10-CM | POA: Diagnosis not present

## 2017-04-26 DIAGNOSIS — F0390 Unspecified dementia without behavioral disturbance: Secondary | ICD-10-CM | POA: Diagnosis not present

## 2017-04-26 DIAGNOSIS — F4321 Adjustment disorder with depressed mood: Secondary | ICD-10-CM | POA: Diagnosis not present

## 2017-04-26 DIAGNOSIS — R251 Tremor, unspecified: Secondary | ICD-10-CM | POA: Diagnosis not present

## 2017-05-01 DIAGNOSIS — I10 Essential (primary) hypertension: Secondary | ICD-10-CM | POA: Diagnosis not present

## 2017-05-01 DIAGNOSIS — F0150 Vascular dementia without behavioral disturbance: Secondary | ICD-10-CM | POA: Diagnosis not present

## 2017-07-10 DIAGNOSIS — Z7189 Other specified counseling: Secondary | ICD-10-CM | POA: Diagnosis not present

## 2017-07-10 DIAGNOSIS — F0150 Vascular dementia without behavioral disturbance: Secondary | ICD-10-CM | POA: Diagnosis not present

## 2017-07-10 DIAGNOSIS — R21 Rash and other nonspecific skin eruption: Secondary | ICD-10-CM | POA: Diagnosis not present

## 2017-08-09 DIAGNOSIS — R451 Restlessness and agitation: Secondary | ICD-10-CM | POA: Diagnosis not present

## 2017-08-09 DIAGNOSIS — F0150 Vascular dementia without behavioral disturbance: Secondary | ICD-10-CM | POA: Diagnosis not present

## 2017-08-09 DIAGNOSIS — Z5181 Encounter for therapeutic drug level monitoring: Secondary | ICD-10-CM | POA: Diagnosis not present

## 2017-10-10 DIAGNOSIS — R4182 Altered mental status, unspecified: Secondary | ICD-10-CM | POA: Diagnosis not present

## 2017-12-11 DIAGNOSIS — G250 Essential tremor: Secondary | ICD-10-CM | POA: Diagnosis not present

## 2017-12-11 DIAGNOSIS — W1789XA Other fall from one level to another, initial encounter: Secondary | ICD-10-CM | POA: Diagnosis not present

## 2018-02-05 DIAGNOSIS — G8929 Other chronic pain: Secondary | ICD-10-CM | POA: Diagnosis not present

## 2018-02-25 DIAGNOSIS — R4189 Other symptoms and signs involving cognitive functions and awareness: Secondary | ICD-10-CM | POA: Diagnosis not present

## 2018-02-25 DIAGNOSIS — Z789 Other specified health status: Secondary | ICD-10-CM | POA: Diagnosis not present

## 2018-03-05 DIAGNOSIS — N390 Urinary tract infection, site not specified: Secondary | ICD-10-CM | POA: Diagnosis not present

## 2018-03-12 DIAGNOSIS — N390 Urinary tract infection, site not specified: Secondary | ICD-10-CM | POA: Diagnosis not present

## 2018-03-12 DIAGNOSIS — F0150 Vascular dementia without behavioral disturbance: Secondary | ICD-10-CM | POA: Diagnosis not present

## 2018-05-19 DIAGNOSIS — J069 Acute upper respiratory infection, unspecified: Secondary | ICD-10-CM | POA: Diagnosis not present

## 2018-08-13 DIAGNOSIS — R451 Restlessness and agitation: Secondary | ICD-10-CM | POA: Diagnosis not present

## 2018-09-16 DIAGNOSIS — E309 Disorder of puberty, unspecified: Secondary | ICD-10-CM | POA: Diagnosis not present

## 2018-09-16 DIAGNOSIS — R6521 Severe sepsis with septic shock: Secondary | ICD-10-CM | POA: Diagnosis not present

## 2018-09-16 DIAGNOSIS — R509 Fever, unspecified: Secondary | ICD-10-CM | POA: Diagnosis not present

## 2018-09-16 DIAGNOSIS — D696 Thrombocytopenia, unspecified: Secondary | ICD-10-CM | POA: Diagnosis not present

## 2018-09-16 DIAGNOSIS — E860 Dehydration: Secondary | ICD-10-CM | POA: Diagnosis not present

## 2018-09-16 DIAGNOSIS — J9600 Acute respiratory failure, unspecified whether with hypoxia or hypercapnia: Secondary | ICD-10-CM | POA: Diagnosis not present

## 2018-09-16 DIAGNOSIS — N3281 Overactive bladder: Secondary | ICD-10-CM | POA: Diagnosis not present

## 2018-09-16 DIAGNOSIS — M2550 Pain in unspecified joint: Secondary | ICD-10-CM | POA: Diagnosis not present

## 2018-09-16 DIAGNOSIS — R0602 Shortness of breath: Secondary | ICD-10-CM | POA: Diagnosis not present

## 2018-09-16 DIAGNOSIS — N179 Acute kidney failure, unspecified: Secondary | ICD-10-CM | POA: Diagnosis not present

## 2018-09-16 DIAGNOSIS — Z209 Contact with and (suspected) exposure to unspecified communicable disease: Secondary | ICD-10-CM | POA: Diagnosis not present

## 2018-09-16 DIAGNOSIS — F0150 Vascular dementia without behavioral disturbance: Secondary | ICD-10-CM | POA: Diagnosis not present

## 2018-09-16 DIAGNOSIS — Z7401 Bed confinement status: Secondary | ICD-10-CM | POA: Diagnosis not present

## 2018-09-16 DIAGNOSIS — E039 Hypothyroidism, unspecified: Secondary | ICD-10-CM | POA: Diagnosis not present

## 2018-09-16 DIAGNOSIS — R062 Wheezing: Secondary | ICD-10-CM | POA: Diagnosis not present

## 2018-09-16 DIAGNOSIS — E785 Hyperlipidemia, unspecified: Secondary | ICD-10-CM | POA: Diagnosis not present

## 2018-09-16 DIAGNOSIS — R05 Cough: Secondary | ICD-10-CM | POA: Diagnosis not present

## 2018-09-16 DIAGNOSIS — G250 Essential tremor: Secondary | ICD-10-CM | POA: Diagnosis not present

## 2018-09-16 DIAGNOSIS — R0902 Hypoxemia: Secondary | ICD-10-CM | POA: Diagnosis not present

## 2018-09-16 DIAGNOSIS — A419 Sepsis, unspecified organism: Secondary | ICD-10-CM | POA: Diagnosis not present

## 2018-09-16 DIAGNOSIS — Z20828 Contact with and (suspected) exposure to other viral communicable diseases: Secondary | ICD-10-CM | POA: Diagnosis not present

## 2018-09-16 DIAGNOSIS — U071 COVID-19: Secondary | ICD-10-CM | POA: Diagnosis not present

## 2018-09-16 DIAGNOSIS — N390 Urinary tract infection, site not specified: Secondary | ICD-10-CM | POA: Diagnosis not present

## 2018-09-16 DIAGNOSIS — R5381 Other malaise: Secondary | ICD-10-CM | POA: Diagnosis not present

## 2018-09-16 DIAGNOSIS — J690 Pneumonitis due to inhalation of food and vomit: Secondary | ICD-10-CM | POA: Diagnosis not present

## 2018-09-16 DIAGNOSIS — A4189 Other specified sepsis: Secondary | ICD-10-CM | POA: Diagnosis not present

## 2018-09-16 DIAGNOSIS — Z66 Do not resuscitate: Secondary | ICD-10-CM | POA: Diagnosis not present

## 2018-09-16 DIAGNOSIS — R918 Other nonspecific abnormal finding of lung field: Secondary | ICD-10-CM | POA: Diagnosis not present

## 2018-09-16 DIAGNOSIS — I4892 Unspecified atrial flutter: Secondary | ICD-10-CM | POA: Diagnosis not present

## 2018-09-28 DIAGNOSIS — F0390 Unspecified dementia without behavioral disturbance: Secondary | ICD-10-CM | POA: Diagnosis not present

## 2018-09-28 DIAGNOSIS — F0150 Vascular dementia without behavioral disturbance: Secondary | ICD-10-CM | POA: Diagnosis not present

## 2018-09-28 DIAGNOSIS — R1310 Dysphagia, unspecified: Secondary | ICD-10-CM | POA: Diagnosis not present

## 2018-09-28 DIAGNOSIS — J168 Pneumonia due to other specified infectious organisms: Secondary | ICD-10-CM | POA: Diagnosis not present

## 2018-09-28 DIAGNOSIS — U071 COVID-19: Secondary | ICD-10-CM | POA: Diagnosis not present

## 2018-09-28 DIAGNOSIS — R404 Transient alteration of awareness: Secondary | ICD-10-CM | POA: Diagnosis not present

## 2018-09-28 DIAGNOSIS — E039 Hypothyroidism, unspecified: Secondary | ICD-10-CM | POA: Diagnosis not present

## 2018-09-28 DIAGNOSIS — L89152 Pressure ulcer of sacral region, stage 2: Secondary | ICD-10-CM | POA: Diagnosis not present

## 2018-09-28 DIAGNOSIS — N390 Urinary tract infection, site not specified: Secondary | ICD-10-CM | POA: Diagnosis not present

## 2018-09-28 DIAGNOSIS — E870 Hyperosmolality and hypernatremia: Secondary | ICD-10-CM | POA: Diagnosis not present

## 2018-09-28 DIAGNOSIS — Y95 Nosocomial condition: Secondary | ICD-10-CM | POA: Diagnosis not present

## 2018-09-28 DIAGNOSIS — E876 Hypokalemia: Secondary | ICD-10-CM | POA: Diagnosis not present

## 2018-09-28 DIAGNOSIS — R5381 Other malaise: Secondary | ICD-10-CM | POA: Diagnosis not present

## 2018-09-28 DIAGNOSIS — R402352 Coma scale, best motor response, localizes pain, at arrival to emergency department: Secondary | ICD-10-CM | POA: Diagnosis not present

## 2018-09-28 DIAGNOSIS — R402222 Coma scale, best verbal response, incomprehensible words, at arrival to emergency department: Secondary | ICD-10-CM | POA: Diagnosis not present

## 2018-09-28 DIAGNOSIS — R000 Tachycardia, unspecified: Secondary | ICD-10-CM | POA: Diagnosis not present

## 2018-09-28 DIAGNOSIS — F419 Anxiety disorder, unspecified: Secondary | ICD-10-CM | POA: Diagnosis not present

## 2018-09-28 DIAGNOSIS — I959 Hypotension, unspecified: Secondary | ICD-10-CM | POA: Diagnosis not present

## 2018-09-28 DIAGNOSIS — J189 Pneumonia, unspecified organism: Secondary | ICD-10-CM | POA: Diagnosis not present

## 2018-09-28 DIAGNOSIS — R0902 Hypoxemia: Secondary | ICD-10-CM | POA: Diagnosis not present

## 2018-09-28 DIAGNOSIS — R4182 Altered mental status, unspecified: Secondary | ICD-10-CM | POA: Diagnosis not present

## 2018-09-28 DIAGNOSIS — G9340 Encephalopathy, unspecified: Secondary | ICD-10-CM | POA: Diagnosis not present

## 2018-09-28 DIAGNOSIS — E878 Other disorders of electrolyte and fluid balance, not elsewhere classified: Secondary | ICD-10-CM | POA: Diagnosis not present

## 2018-09-28 DIAGNOSIS — Z515 Encounter for palliative care: Secondary | ICD-10-CM | POA: Diagnosis not present

## 2018-09-28 DIAGNOSIS — Z7401 Bed confinement status: Secondary | ICD-10-CM | POA: Diagnosis not present

## 2018-09-28 DIAGNOSIS — G250 Essential tremor: Secondary | ICD-10-CM | POA: Diagnosis not present

## 2018-09-28 DIAGNOSIS — L89616 Pressure-induced deep tissue damage of right heel: Secondary | ICD-10-CM | POA: Diagnosis not present

## 2018-09-28 DIAGNOSIS — Z20828 Contact with and (suspected) exposure to other viral communicable diseases: Secondary | ICD-10-CM | POA: Diagnosis not present

## 2018-09-28 DIAGNOSIS — R402142 Coma scale, eyes open, spontaneous, at arrival to emergency department: Secondary | ICD-10-CM | POA: Diagnosis not present

## 2018-09-28 DIAGNOSIS — J181 Lobar pneumonia, unspecified organism: Secondary | ICD-10-CM | POA: Diagnosis not present

## 2018-09-28 DIAGNOSIS — M2550 Pain in unspecified joint: Secondary | ICD-10-CM | POA: Diagnosis not present

## 2018-10-29 DEATH — deceased
